# Patient Record
Sex: Male | Born: 1970
Health system: Southern US, Community
[De-identification: ages and names within clinical notes are randomized; demographics above are authoritative.]

## PROBLEM LIST (undated history)

## (undated) DIAGNOSIS — C9110 Chronic lymphocytic leukemia of B-cell type not having achieved remission: Secondary | ICD-10-CM

## (undated) DIAGNOSIS — I10 Essential (primary) hypertension: Secondary | ICD-10-CM

---

## 2008-11-03 ENCOUNTER — Emergency Department (HOSPITAL_COMMUNITY): Admission: EM | Admit: 2008-11-03 | Discharge: 2008-11-03 | Payer: Self-pay | Admitting: Emergency Medicine

## 2011-04-19 NOTE — Consult Note (Signed)
NAMEORESTE, MAJEED                 ACCOUNT NO.:  [id number]   MEDICAL RECORD NO.:  [id number]          PATIENT TYPE:  EMS   LOCATION:  [facility]                         FACILITY:  [facility]   PHYSICIAN:  Pramod P. Pearlean Brownie, MD    DATE OF BIRTH:  03/28/1971   DATE OF CONSULTATION:  DATE OF DISCHARGE:  11/03/2008                                 CONSULTATION   REFERRING PHYSICIAN:  Orlene Och, MD   REASON FOR REFERRAL:  Headache.   HISTORY OF PRESENT ILLNESS:  Mr. Brosh is a 40 year old Caucasian male  who is a Emergency planning/management officer who states that this morning he developed  left frontal headache as well as feeling of sweating and after an hour  or so, the headache got severe.  He came to the emergency room where he  was given Dilaudid which helped reduce the headache from 8/10 to 2/10.  However, an hour later, now the headache is coming back and it is back  up to 7/10.  He did have some nausea and vomiting early on, but after  the Dilaudid that seems to settle.  He also had some blurred vision and  had difficulty focusing and feeling sweaty with the headaches but that  also seems to have settled down now.  He denied any double vision,  vertigo, focal extremity weakness, numbness, gait, or balance problems.  Denies any eye pain, fever, nausea, light sensitivity, sound  sensitivity, neck stiffness, and neck pain.  He has no prior history of  migraine headaches, similar headaches in the past.   PAST MEDICAL HISTORY:  Significant for hypertension, allergies, and  hyperlipidemia.   HOME MEDICATIONS:  Lisinopril, Nasonex, and Lipitor.   SOCIAL HISTORY:  The patient lives in [place].  He does not smoke.   REVIEW OF SYSTEMS:  Negative for recent fever, cough, diarrhea, illness,  shortness of breath, or chest pain.   PHYSICAL EXAM:  GENERAL:  A well-built young Caucasian male who is not  in distress.  VITAL SIGNS:  He afebrile.  Pulse rate 64 per minute and regular,  temperature 97.6, blood  pressure 131/86, respiratory rate 20 per minute,  sats 100% on room air.  HEENT:  Head is nontraumatic.  NECK:  Supple.  There is no stiffness.  CARDIAC:  Regular heart sounds.  LUNGS:  Clear to auscultation.  ABDOMEN:  Soft and nontender.  NEUROLOGIC:  The patient is pleasant, awake, alert, cooperative with no  aphasia, apraxia, or dysarthria.  Eye movements are full range.  Visual  acuity and fields are adequate to bedside testing.  Face is symmetric.  Tongue is midline.  Motor system exam reveals no upper extremity drift,  symmetric strength, tone, reflexes, coordination, sensation.  His gait  was not tested.   DATA REVIEWED:  Noncontrast CAT scan of the head done today reveals no  acute abnormality.  No subarachnoid blood is noted.   LABS:  WBC count is normal.  Electrolytes are normal.  Coagulation labs  are normal.   IMPRESSION:  A 40 year old gentleman with new onset severe refractory  left frontal  headache of undetermined etiology.  Subarachnoid hemorrhage  is still with small possibility despite the negative CT and further  evaluation is necessary.   PLAN:  I would recommend checking MRI scan of the brain with particular  attention to the FLAIR images for subarachnoid blood as well as to rule  out an underlying structural lesion.  If this is negative and the  patient's symptoms are not resolved, I need to do a spinal tap as well  to look for red cells.  Treat his headache with IV Toradol 30 mg.  We  will follow up from the results of the MRI and spinal tap if necessary.  Kindly call for questions.           ______________________________  Sunny Schlein. Pearlean Brownie, MD     PPS/MEDQ  D:  11/03/2008  T:  11/04/2008  Job:  [id number]

## 2011-09-06 LAB — DIFFERENTIAL
Eosinophils Absolute: 0 10*3/uL (ref 0.0–0.7)
Eosinophils Relative: 1 % (ref 0–5)
Lymphocytes Relative: 32 % (ref 12–46)
Lymphs Abs: 2.5 10*3/uL (ref 0.7–4.0)
Monocytes Relative: 9 % (ref 3–12)
Neutro Abs: 4.6 10*3/uL (ref 1.7–7.7)

## 2011-09-06 LAB — POCT CARDIAC MARKERS
CKMB, poc: 2.2 ng/mL (ref 1.0–8.0)
Myoglobin, poc: 77.2 ng/mL (ref 12–200)
Troponin i, poc: <0.05 ng/mL (ref 0.00–0.09)

## 2011-09-06 LAB — CBC
HCT: 45.8 % (ref 39.0–52.0)
Hemoglobin: 15.3 g/dL (ref 13.0–17.0)
MCV: 89 fL (ref 78.0–100.0)
Platelets: 249 10*3/uL (ref 150–400)
RDW: 13.1 % (ref 11.5–15.5)

## 2011-09-06 LAB — POCT I-STAT, CHEM 8
BUN: 15 mg/dL (ref 6–23)
Calcium, Ion: 1.17 mmol/L (ref 1.12–1.32)
Chloride: 106 meq/L (ref 96–112)
Creatinine, Ser: 0.9 mg/dL (ref 0.4–1.5)
Glucose, Bld: 111 mg/dL — ABNORMAL HIGH (ref 70–99)
HCT: 47 % (ref 39.0–52.0)
Hemoglobin: 16 g/dL (ref 13.0–17.0)
Potassium: 3.5 meq/L (ref 3.5–5.1)
Sodium: 140 meq/L (ref 135–145)
TCO2: 25 mmol/L (ref 0–100)

## 2011-09-06 LAB — PROTIME-INR: Prothrombin Time: 13.2 seconds (ref 11.6–15.2)

## 2011-09-06 LAB — GLUCOSE, CAPILLARY: Glucose-Capillary: 120 mg/dL — ABNORMAL HIGH (ref 70–99)

## 2014-10-07 ENCOUNTER — Encounter: Payer: Self-pay | Admitting: Physical Therapy

## 2014-10-07 ENCOUNTER — Ambulatory Visit: Attending: Sports Medicine | Admitting: Physical Therapy

## 2014-10-07 VITALS — BP 160/102

## 2014-10-07 DIAGNOSIS — Z8781 Personal history of (healed) traumatic fracture: Secondary | ICD-10-CM | POA: Insufficient documentation

## 2014-10-07 DIAGNOSIS — M25512 Pain in left shoulder: Secondary | ICD-10-CM | POA: Diagnosis present

## 2014-10-07 DIAGNOSIS — S42002A Fracture of unspecified part of left clavicle, initial encounter for closed fracture: Secondary | ICD-10-CM

## 2014-10-07 NOTE — Patient Instructions (Addendum)
Shoulder Exercises EXERCISES  RANGE OF MOTION (ROM) AND STRETCHING EXERCISES These exercises may help you when beginning to rehabilitate your injury. Your symptoms may resolve with or without further involvement from your physician, physical therapist or athletic trainer. While completing these exercises, remember:   Restoring tissue flexibility helps normal motion to return to the joints. This allows healthier, less painful movement and activity.  An effective stretch should be held for at least 30 seconds.  A stretch should never be painful. You should only feel a gentle lengthening or release in the stretched tissue. ROM - Pendulum  Bend at the waist so that your right / left arm falls away from your body. Support yourself with your opposite hand on a solid surface, such as a table or a countertop.  Your right / left arm should be perpendicular to the ground. If it is not perpendicular, you need to lean over farther. Relax the muscles in your right / left arm and shoulder as much as possible.  Gently sway your hips and trunk so they move your right / left arm without any use of your right / left shoulder muscles.  Progress your movements so that your right / left arm moves side to side, then forward and backward, and finally, both clockwise and counterclockwise.  Complete __________ repetitions in each direction. Many people use this exercise to relieve discomfort in their shoulder as well as to gain range of motion. Repeat __________ times. Complete this exercise __________ times per day. STRETCH - Flexion, Standing  Stand with good posture. With an underhand grip on your right / left hand and an overhand grip on the opposite hand, grasp a broomstick or cane so that your hands are a little more than shoulder-width apart.  Keeping your right / left elbow straight and shoulder muscles relaxed, push the stick with your opposite hand to raise your right / left arm in front of your body and  then overhead. Raise your arm until you feel a stretch in your right / left shoulder, but before you have increased shoulder pain.  Try to avoid shrugging your right / left shoulder as your arm rises by keeping your shoulder blade tucked down and toward your mid-back spine. Hold __________ seconds.  Slowly return to the starting position. Repeat __________ times. Complete this exercise __________ times per day. STRETCH - Internal Rotation  Place your right / left hand behind your back, palm-up.  Throw a towel or belt over your opposite shoulder. Grasp the towel/belt with your right / left hand.  While keeping an upright posture, gently pull up on the towel/belt until you feel a stretch in the front of your right / left shoulder.  Avoid shrugging your right / left shoulder as your arm rises by keeping your shoulder blade tucked down and toward your mid-back spine.  Hold __________. Release the stretch by lowering your opposite hand. Repeat __________ times. Complete this exercise __________ times per day. STRETCH - External Rotation and Abduction  Stagger your stance through a doorframe. It does not matter which foot is forward.  As instructed by your physician, physical therapist or athletic trainer, place your hands:  And forearms above your head and on the door frame.  And forearms at head-height and on the door frame.  At elbow-height and on the door frame.  Keeping your head and chest upright and your stomach muscles tight to prevent over-extending your low-back, slowly shift your weight onto your front foot until you feel   a stretch across your chest and/or in the front of your shoulders.  Hold __________ seconds. Shift your weight to your back foot to release the stretch. Repeat __________ times. Complete this stretch __________ times per day.

## 2014-10-07 NOTE — Therapy (Signed)
Physical Therapy Evaluation  Patient Details  Name: Aaron Diaz MRN: [id number] Date of Birth: Oct 31, 1971  Encounter Date: 10/07/2014      PT End of Session - 10/07/14 1621    Visit Number 1   Number of Visits 5   Date for PT Re-Evaluation 11/11/14   Authorization - Number of Visits 5   PT Start Time 1546   PT Stop Time 1618   PT Time Calculation (min) 32 min   Activity Tolerance Patient tolerated treatment well;Patient limited by pain      No past medical history on file.  No past surgical history on file.  BP 160/102 mmHg  Visit Diagnosis:  Left shoulder pain  Fx clavicle, left, closed, initial encounter      Subjective Assessment - 10/07/14 1553    Symptoms Pt is a 43 y/o male who presents to OPPT s/p L clavicle fx.  Pt. reported fx occurred in [place] after a fall playing football 08/25/14.  S/P ORIF 09/02/14.     Pertinent History HTN   Limitations House hold activities;Lifting  no lifting until 6 weeks post-op   Patient Stated Goals improve full ROM, increase strength   Currently in Pain? Yes   Pain Score 2    Pain Location Shoulder   Pain Orientation Left   Pain Descriptors / Indicators Aching;Discomfort   Pain Type Surgical pain   Pain Onset More than a month ago   Pain Frequency Intermittent   Aggravating Factors  nothing   Pain Relieving Factors nothing   Multiple Pain Sites No          [facility] PT Assessment - 10/07/14 1500    Assessment   Medical Diagnosis L clavicle fx s/p ORIF   Onset Date 09/02/14   Next MD Visit 11/04/14   Prior Therapy n/a   Precautions   Precautions Shoulder   Type of Shoulder Precautions no lifting > 5#   Restrictions   Weight Bearing Restrictions Yes   LUE Weight Bearing Non weight bearing   Balance Screen   Has the patient fallen in the past 6 months Yes   How many times? 1   Has the patient had a decrease in activity level because of a fear of falling?  No   Is the patient reluctant to leave their home because of  a fear of falling?  No   Home Environment   Family/patient expects to be discharged to: Private residence   Living Arrangements Spouse/significant other;Children   Available Help at Discharge Available PRN/intermittently;Family   Type of [place] to enter   Entrance Stairs-Number of Steps 4   Entrance Stairs-Rails None   Home Layout Two level   Alternate Level Stairs-Number of Steps 13   Alternate Level Stairs-Rails Can reach both   Home Equipment None   Prior Function   Level of Independence Independent with homemaking with ambulation;Independent with gait;Independent with transfers   Vocation Full time employment  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   Vocation Requirements sitting and standing throughout the day, no heavy lifting   Observation/Other Assessments   Focus on Therapeutic Outcomes (FOTO)  not completed; staff did not capture   Posture/Postural Control   Posture/Postural Control Postural limitations   Postural Limitations rounded shoulders and forward head posture   AROM   Overall AROM  Deficits   Overall AROM Comments L elbow and wrist WFL   Left Shoulder Flexion 119 Degrees   Left Shoulder ABduction 87 Degrees  Left Shoulder Internal Rotation 90 Degrees   Left Shoulder External Rotation 70 Degrees   Strength   Left Shoulder Flexion 3-/5   Left Shoulder ABduction 3-/5   Left Shoulder Internal Rotation 2/5   Left Shoulder External Rotation 2/5          Adult PT Treatment/Exercise - 10/07/14 0700    Exercises   Exercises Shoulder   Shoulder Exercises: Standing   Flexion AAROM;Left;10 reps;Other (comment)  cane   ABduction AAROM;Left;10 reps;Other (comment)  cane   Shoulder Exercises: Stretch   Corner Stretch 2 reps;20 seconds   Internal Rotation Stretch 2 reps  20 seconds, L shoulder          Education - 10/07/14 1621    Education provided Yes   Education Details HEP   Education Details Patient   Methods  Explanation;Demonstration;Handout   Comprehension Verbalized understanding;Returned demonstration            PT Long Term Goals - 10/07/14 1624    PT LONG TERM GOAL #1   Title independent with advanced HEP   Time 5   Period Weeks   Status New   PT LONG TERM GOAL #2   Title improve AROM L shoulder flexion to > 130 for improved function   Time 5   Period Weeks   Status New   PT LONG TERM GOAL #3   Title improve AROM L shoulder abdct to > 100 degrees for improved function   Time 5   Period Weeks   PT LONG TERM GOAL #4   Title report pain < 2/10 with exercises and activity   Time 5   Period Weeks   Status New          Plan - 10/07/14 1622    Clinical Impression Statement Pt. presents to OPPT for the above stated limitations.  Will benefit from skilled physical therapy to address deficits listed above and maximize function.   Pt will benefit from skilled therapeutic intervention in order to improve on the following deficits Decreased strength;Pain;Improper body mechanics;Decreased range of motion;Impaired perceived functional ability;Impaired UE functional use   Rehab Potential Good   PT Frequency Min 1X/week   PT Duration Other (comment)  5 weeks   PT Treatment/Interventions Functional mobility training;Therapeutic activities;Patient/family education;Neuromuscular re-education;Modalities;Manual techniques;Therapeutic exercise  MHP/CP, estim, ultrasound, vaso compression   PT Plan review HEP, progress ROM, can add strengthening exercises once pt. > 6 weeks post-op        Problem List There are no active problems to display for this patient.  Certification faxed to MD due to provider not in system.                                          Faustino Congress K 10/07/2014, 5:03 PM

## 2014-10-22 ENCOUNTER — Ambulatory Visit

## 2014-10-22 ENCOUNTER — Telehealth: Payer: Self-pay | Admitting: *Deleted

## 2014-10-22 DIAGNOSIS — M25512 Pain in left shoulder: Secondary | ICD-10-CM | POA: Diagnosis not present

## 2014-10-22 DIAGNOSIS — S42002D Fracture of unspecified part of left clavicle, subsequent encounter for fracture with routine healing: Secondary | ICD-10-CM

## 2014-10-22 NOTE — Telephone Encounter (Signed)
appts made and printed...td 

## 2014-10-22 NOTE — Patient Instructions (Signed)
Cane Overhead - Standing   PALMS FACE UP. With arms straight, hold cane forward at waist. Raise cane above head. Hold _3__ seconds. Repeat _10__ times. Do _2__ times per day.   Scapular Retraction (Standing)   With arms at sides, pinch shoulder blades together. Repeat __10__ times per set. Do __2__ sets per session. Do __10_ sessions per day.  http://orth.exer.us/944    Copyright  VHI. All rights reserved.   Shoulder Press: Supine with Protraction   (Without tubing) push fists further toward ceiling from shoulder blades. Repeat _10_ times per set. Do _2_ sets per session. Do _2_ sessions per week.  http://tub.exer.us/291   Copyright  VHI. All rights reserved.

## 2014-10-22 NOTE — Therapy (Signed)
Physical Therapy Treatment  Patient Details  Name: Aaron Diaz MRN: [id number] Date of Birth: 1971-01-26  Encounter Date: 10/22/2014      PT End of Session - 10/22/14 1723    Visit Number 2   Number of Visits 5   Date for PT Re-Evaluation 11/11/14   PT Start Time 1630   PT Stop Time 1726   PT Time Calculation (min) 56 min   Activity Tolerance Patient tolerated treatment well;Patient limited by pain   Behavior During Therapy [facility] for tasks assessed/performed      No past medical history on file.  No past surgical history on file.  There were no vitals taken for this visit.  Visit Diagnosis:  No diagnosis found.      Subjective Assessment - 10/22/14 1633    Symptoms Pt sees MD for Follow up  on 11/04/14 with Dr Davina Poke.  pt reports he is doing well, 50% improved and limited by MD order: no lifting for remainer for this week, and may start next week lifting 5 lbs.    Pertinent History HTN   Limitations House hold activities;Lifting   Patient Stated Goals improve full ROM, increase strength   Currently in Pain? Yes   Pain Score 1    Pain Location Shoulder   Pain Orientation Left   Pain Descriptors / Indicators Aching;Discomfort   Pain Type Surgical pain   Pain Onset More than a month ago   Pain Frequency Intermittent          [facility] PT Assessment - 10/22/14 1638    AROM   Left Shoulder Flexion 132 Degrees   Left Shoulder ABduction 119 Degrees   Left Shoulder Internal Rotation 90 Degrees  FIR T12   Left Shoulder External Rotation 70 Degrees  T2   Strength   Left Shoulder Flexion 3/5   Left Shoulder ABduction 3/5   Left Shoulder Internal Rotation 3+/5   Left Shoulder External Rotation 3+/5          [facility] Adult PT Treatment/Exercise - 10/22/14 1643    Shoulder Exercises: Standing   External Rotation AAROM;Left;10 reps   External Rotation Weight (lbs) 10 sec hold   Flexion AAROM;Left;10 reps;Other (comment)  cane, palms up   ABduction AAROM;Left;10 reps;Other  (comment)  cane, supinated   Retraction Strengthening;10 reps  5 sec hold   Other Standing Exercises AAROM flexion, ball on wall, 10 x 2 sets   Shoulder Exercises: Pulleys   Flexion 2 minutes   ABduction 2 minutes   Modalities   Modalities Cryotherapy   Cryotherapy   Number Minutes Cryotherapy 10 Minutes   Cryotherapy Location Shoulder   Type of Cryotherapy Ice pack   Manual Therapy   Manual Therapy Joint mobilization   Joint Mobilization grade 2-3 post inf glide to promote flex/ abd. genlte GH distraction          PT Education - 10/22/14 1723    Education provided Yes   Education Details Progressed HEP   Person(s) Educated Patient   Methods Explanation;Demonstration;Verbal cues;Handout   Comprehension Verbalized understanding;Returned demonstration;Verbal cues required;Tactile cues required              Plan - 10/22/14 1726    Clinical Impression Statement Reassessed AROM and strength with good improvements noted since last visit. Pt progresses to lifting 5 lb next week. Currently, remains restricted with lifting. Progressed HEP with AAROM ther ex. Pt would benefit from continued PT to progress pt to goals and return to PLOF.  Pt will benefit from skilled therapeutic intervention in order to improve on the following deficits Pain;Decreased mobility;Decreased strength;Decreased range of motion;Decreased activity tolerance;Impaired UE functional use   Rehab Potential Excellent   PT Frequency 2x / week   PT Treatment/Interventions ADLs/Self Care Home Management;Therapeutic activities;Therapeutic exercise;Manual techniques   PT Next Visit Plan Progress HEP.  PREs after 6 weeks post op.    Consulted and Agree with Plan of Care Patient   PT Plan review HEP, progress ROM, can add strengthening exercises once pt. > 6 weeks post-op        Problem List There are no active problems to display for this  patient.                                             Dollene Cleveland, PT 10/22/2014, 5:32 PM

## 2014-11-04 ENCOUNTER — Encounter

## 2014-11-06 ENCOUNTER — Ambulatory Visit: Attending: Nurse Practitioner

## 2014-11-06 DIAGNOSIS — S42002S Fracture of unspecified part of left clavicle, sequela: Secondary | ICD-10-CM

## 2014-11-06 DIAGNOSIS — Z8781 Personal history of (healed) traumatic fracture: Secondary | ICD-10-CM | POA: Insufficient documentation

## 2014-11-06 DIAGNOSIS — M25512 Pain in left shoulder: Secondary | ICD-10-CM | POA: Insufficient documentation

## 2014-11-06 NOTE — Therapy (Signed)
Outpatient [facility] [address] [place], [place], [address] Phone: [phone]   Fax:  [phone]  Physical Therapy Treatment  Patient Details  Name: Aaron Diaz MRN: [id number] Date of Birth: 08-01-1971  Encounter Date: 11/06/2014      PT End of Session - 11/06/14 1712    Visit Number 3   Number of Visits 5   Date for PT Re-Evaluation 11/11/14   Authorization - Number of Visits 5   PT Start Time 1630   PT Stop Time 1720   PT Time Calculation (min) 50 min   Activity Tolerance Patient tolerated treatment well   Behavior During Therapy [facility] for tasks assessed/performed      No past medical history on file.  No past surgical history on file.  There were no vitals taken for this visit.  Visit Diagnosis:  Fracture of left clavicle, sequela      Subjective Assessment - 11/06/14 1640    Symptoms Pt rates pain 2/10 today. Pt reports "it is getting better". Pt saw MD yesterday and MD said all is healing well. Plans to Follow-up with MD in 4 weeks.    Pertinent History HTN   Patient Stated Goals improve full ROM, increase strength   Currently in Pain? Yes   Pain Score 2    Pain Location Shoulder   Pain Orientation Left   Pain Descriptors / Indicators Aching;Discomfort   Pain Type Surgical pain   Pain Onset More than a month ago            [facility] Adult PT Treatment/Exercise - 11/06/14 1651    Shoulder Exercises: Supine   Protraction Strengthening;20 reps;Weights   Protraction Weight (lbs) 1   Flexion Strengthening;20 reps;Weights   Shoulder Flexion Weight (lbs) 1   Flexion Limitations chest press movement     Other Supine Exercises UBE 2 min forward, 2 min back    Shoulder Exercises: Sidelying   External Rotation AROM;Left;20 reps  10 reps x 3 sets   External Rotation Weight (lbs) 0   ABduction AROM;Left;20 reps  10 x 3 sets   ABduction Weight (lbs) 0   Shoulder Exercises: Standing   External Rotation AROM;Left;10 reps   Sidelying   Flexion AROM;Left;20 reps;Other (comment)  palms up, 10 x 2   ABduction AROM;Left;20 reps;Other (comment)   supinated, 10 x 2   Retraction Strengthening;Both;10 reps;Theraband  5 sec hold   Theraband Level (Shoulder Retraction) Level 2 (Red)   Other Standing Exercises AROM flexion, ball on wall, 10 x 2 sets   Shoulder Exercises: Pulleys   Flexion 2 minutes   ABduction 2 minutes                Plan - 11/06/14 1712    Clinical Impression Statement Improved pain-free PROM and AROM today. Progressed to light resistance and AROM ther ex with no increase in pain. Pt would benefit from continued PT to return to PLOF.    Pt will benefit from skilled therapeutic intervention in order to improve on the following deficits Pain;Decreased mobility;Decreased strength;Decreased range of motion;Decreased activity tolerance;Impaired UE functional use   Rehab Potential Excellent   PT Frequency 2x / week   PT Treatment/Interventions ADLs/Self Care Home Management;Therapeutic activities;Therapeutic exercise;Manual techniques   PT Next Visit Plan Progress HEP.  PREs after 6 weeks post op.  Problem List There are no active problems to display for this patient.  Dollene Cleveland, PT, DPT 11/06/2014 5:27 PM Phone: [phone] Fax: [phone]

## 2014-11-10 ENCOUNTER — Ambulatory Visit: Admitting: Physical Therapy

## 2014-11-10 DIAGNOSIS — S42002A Fracture of unspecified part of left clavicle, initial encounter for closed fracture: Secondary | ICD-10-CM

## 2014-11-10 DIAGNOSIS — M25512 Pain in left shoulder: Secondary | ICD-10-CM | POA: Diagnosis not present

## 2014-11-10 DIAGNOSIS — S42002S Fracture of unspecified part of left clavicle, sequela: Secondary | ICD-10-CM

## 2014-11-10 DIAGNOSIS — S42002D Fracture of unspecified part of left clavicle, subsequent encounter for fracture with routine healing: Secondary | ICD-10-CM

## 2014-11-10 NOTE — Therapy (Signed)
Outpatient Rehabilitation [facility] [address] [place], [place], [address] Phone: [phone]   Fax:  [phone]  Physical Therapy Treatment  Patient Details  Name: Aaron Diaz MRN: [id number] Date of Birth: [birth year]  Encounter Date: 11/10/2014      PT End of Session - 11/10/14 1702    Visit Number 4   Number of Visits 5   Date for PT Re-Evaluation 11/11/14   Authorization - Number of Visits 5   PT Start Time 1631   PT Stop Time 1712   PT Time Calculation (min) 41 min   Activity Tolerance Patient tolerated treatment well   Behavior During Therapy [facility] for tasks assessed/performed      No past medical history on file.  No past surgical history on file.  There were no vitals taken for this visit.  Visit Diagnosis:  Fracture of left clavicle, sequela  Clavicle fracture, left, with routine healing, subsequent encounter  Left shoulder pain  Fx clavicle, left, closed, initial encounter      Subjective Assessment - 11/10/14 1634    Symptoms Pt reports L shoulder improving; everything healing well.   Pertinent History HTN   Limitations House hold activities;Lifting   Patient Stated Goals improve full ROM, increase strength   Currently in Pain? Yes   Pain Score 3    Pain Location Shoulder   Pain Orientation Left   Pain Descriptors / Indicators Aching;Discomfort   Pain Type Surgical pain   Pain Onset More than a month ago   Pain Frequency Intermittent   Aggravating Factors  cold weather   Multiple Pain Sites No            [facility] Adult PT Treatment/Exercise - 11/10/14 1635    Exercises   Exercises Shoulder   Shoulder Exercises: Supine   Protraction Strengthening;Left;Weights;15 reps  x 2 sets   Protraction Weight (lbs) 2   Flexion Strengthening;Left;15 reps;Weights  x 2 reps   Shoulder Flexion Weight (lbs) 2   Flexion Limitations chest press with 2#x15 reps; 3# x 15 reps   Shoulder Exercises: Prone   Flexion Strengthening;Left;15  reps;Weights  x 2 sets   Flexion Weight (lbs) 2   Extension Strengthening;Left;15 reps;Weights  x2sets   Extension Weight (lbs) 2   Horizontal ABduction 1 Strengthening;Left;15 reps;Weights  x2 sets   Horizontal ABduction 1 Weight (lbs) 2   Shoulder Exercises: Sidelying   External Rotation Strengthening;Left;15 reps;Weights  x 2 sets   External Rotation Weight (lbs) 2   ABduction Strengthening;Left;15 reps;Weights  x 2 sets   ABduction Weight (lbs) 2   Cryotherapy   Number Minutes Cryotherapy 10 Minutes   Cryotherapy Location Shoulder   Type of Cryotherapy Ice pack   Shoulder Exercises: ROM/Strengthening   UBE (Upper Arm Bike) Level 3.5 x 8 min, alt 1 min forward, 1 min backwards          PT Education - 11/10/14 1702    Education provided No            PT Long Term Goals - 11/10/14 1705    PT LONG TERM GOAL #1   Title independent with advanced HEP   Time 5   Period Weeks   Status On-going   PT LONG TERM GOAL #2   Title improve AROM L shoulder flexion to > 130 for improved function   Time 5   Period Weeks   Status On-going   PT LONG TERM GOAL #3   Title improve AROM L shoulder abdct to >  100 degrees for improved function   Time 5   Period Weeks   Status On-going   PT LONG TERM GOAL #4   Title report pain < 2/10 with exercises and activity   Time 5   Period Weeks   Status On-going          Plan - 11/10/14 1703    Clinical Impression Statement Pt progressing ROM and strengthening.  Pt c/o of dull ache and muscle fatigue after session.  Will plan to d/c next visit.   PT Next Visit Plan Check goals, d/c, FOTO, update HEP as needed   Consulted and Agree with Plan of Care Patient                               Problem List There are no active problems to display for this patient.    Laureen Abrahams, PT, DPT 11/10/2014 5:14 PM  [facility] [address] [place] [address]  [phone]  (office) [phone] (fax)

## 2014-11-13 ENCOUNTER — Ambulatory Visit: Admitting: Physical Therapy

## 2014-11-13 DIAGNOSIS — S42002S Fracture of unspecified part of left clavicle, sequela: Secondary | ICD-10-CM

## 2014-11-13 DIAGNOSIS — S42002A Fracture of unspecified part of left clavicle, initial encounter for closed fracture: Secondary | ICD-10-CM

## 2014-11-13 DIAGNOSIS — S42002D Fracture of unspecified part of left clavicle, subsequent encounter for fracture with routine healing: Secondary | ICD-10-CM

## 2014-11-13 DIAGNOSIS — M25512 Pain in left shoulder: Secondary | ICD-10-CM

## 2014-11-13 NOTE — Patient Instructions (Signed)
Strengthening: Resisted Internal Rotation   Hold tubing in left hand, elbow at side and forearm out. Rotate forearm in across body. Repeat _10-15___ times per set. Do _1___ sets per session. Do __1-3__ sessions per day.  http://orth.exer.us/830   Copyright  VHI. All rights reserved.  Strengthening: Resisted External Rotation   Hold tubing in right hand, elbow at side and forearm across body. Rotate forearm out. Repeat _10-15___ times per set. Do __1__ sets per session. Do _1-3___ sessions per day.  http://orth.exer.us/828   Copyright  VHI. All rights reserved.  Strengthening: Resisted Flexion   Hold tubing with left arm at side. Pull forward and up. Move shoulder through pain-free range of motion. Repeat _10-15___ times per set. Do _1___ sets per session. Do _1-3___ sessions per day.  http://orth.exer.us/824   Copyright  VHI. All rights reserved.  Strengthening: Resisted Extension   Hold tubing in right hand, arm forward. Pull arm back, elbow straight. Repeat _10-15___ times per set. Do __1__ sets per session. Do _1-3___ sessions per day.  http://orth.exer.us/832   Copyright  VHI. All rights reserved.   Strengthening: Resisted Abduction   Hold tubing with right arm across body. Pull up and away from side. Move through pain-free range of motion. Repeat __10-15__ times per set. Do _1___ sets per session. Do _1-3__ sessions per day.  http://orth.exer.us/826   Copyright  VHI. All rights reserved.   Laureen Abrahams, PT, DPT 11/13/2014 4:53 PM  [facility] [address], [place] [address]  [phone] (office) [phone] (fax)

## 2014-11-13 NOTE — Therapy (Signed)
Outpatient Rehabilitation [facility] [address] [place], [place], [address] Phone: [phone]   Fax:  [phone]  Physical Therapy Treatment/Discharge  Patient Details  Name: Aaron Diaz MRN: [id number] Date of Birth: 10-28-1971  Encounter Date: 11/13/2014      PT End of Session - 11/13/14 1658    Visit Number 5   Number of Visits 5   PT Start Time 1628   PT Stop Time 1653   PT Time Calculation (min) 25 min   Activity Tolerance Patient tolerated treatment well   Behavior During Therapy [facility] for tasks assessed/performed      No past medical history on file.  No past surgical history on file.  There were no vitals taken for this visit.  Visit Diagnosis:  Fracture of left clavicle, sequela  Clavicle fracture, left, with routine healing, subsequent encounter  Left shoulder pain  Fx clavicle, left, closed, initial encounter      Subjective Assessment - 11/13/14 1631    Symptoms Lifting up to 10# now, arm feels better   Pertinent History HTN   Limitations House hold activities;Lifting   Patient Stated Goals improve full ROM, increase strength   Currently in Pain? Yes   Pain Score 3    Pain Location Shoulder   Pain Orientation Left   Pain Type Surgical pain   Pain Onset More than a month ago   Pain Frequency Intermittent   Aggravating Factors  cold weather          [facility] PT Assessment - 11/13/14 1700    AROM   Left Shoulder Flexion 155 Degrees   Left Shoulder ABduction 149 Degrees          [facility] Adult PT Treatment/Exercise - 11/13/14 1632    Shoulder Exercises: Standing   Protraction Strengthening;Left;10 reps;Theraband   Theraband Level (Shoulder Protraction) Level 3 (Green)   External Rotation Strengthening;Left;10 reps;Theraband   Theraband Level (Shoulder External Rotation) Level 3 (Green)   Internal Rotation Strengthening;Left;10 reps;Theraband   Theraband Level (Shoulder Internal Rotation) Level 3 (Green)   Flexion  Strengthening;Left;10 reps;Theraband   Theraband Level (Shoulder Flexion) Level 3 (Green)   ABduction Strengthening;Left;10 reps;Theraband   Theraband Level (Shoulder ABduction) Level 2 (Red)   Retraction Strengthening;Left;10 reps;Theraband   Theraband Level (Shoulder Retraction) Level 3 (Green)   Shoulder Exercises: ROM/Strengthening   UBE (Upper Arm Bike) Level 4.5 x 8 min, alt 1 min forward, 1 min backwards          PT Education - 11/13/14 1657    Education provided Yes   Education Details HEP, scar mobilization, progression and transition to community fitness   Person(s) Educated Patient   Methods Explanation;Demonstration;Handout   Comprehension Verbalized understanding;Returned demonstration            PT Long Term Goals - 11/13/14 1700    PT LONG TERM GOAL #1   Title independent with advanced HEP   Time 5   Period Weeks   Status Achieved   PT LONG TERM GOAL #2   Title improve AROM L shoulder flexion to > 130 for improved function   Time 5   Period Weeks   Status Achieved   PT LONG TERM GOAL #3   Title improve AROM L shoulder abdct to > 100 degrees for improved function   Time 5   Period Weeks   Status Achieved   PT LONG TERM GOAL #4   Title report pain < 2/10 with exercises and activity   Time 5   Period  Weeks   Status Not Met          Plan - 11/13/14 1659    Clinical Impression Statement Pt has met all goals except pain still 3/10.  Pt demonstrates improved ROM and understanding for safe transition to community fitness.     PT Next Visit Plan d/c PT   Consulted and Agree with Plan of Care Patient                               Problem List There are no active problems to display for this patient.  Laureen Abrahams, PT, DPT 11/13/2014 5:02 PM  [facility] [address] [place], [place] [address]  [phone] (office) [phone] (fax)   PHYSICAL THERAPY DISCHARGE SUMMARY  Visits from Start  of Care: 5  Current functional level related to goals / functional outcomes: See above   Remaining deficits: L shoulder/scar pain: Rated 3/10 at d/c which is decreased from initial eval.  Pt continues to demonstrate improvements and is ready to safely transition to home and community programs.   Education / Equipment: HEP, transition to community fitness  Plan: Patient agrees to discharge.  Patient goals were met. Patient is being discharged due to meeting the stated rehab goals.  ?????        Laureen Abrahams, PT, DPT 11/13/2014 5:03 PM  [facility] [address] [place] [address]  [phone] (office) [phone] (fax)

## 2017-01-04 DIAGNOSIS — B349 Viral infection, unspecified: Secondary | ICD-10-CM | POA: Diagnosis not present

## 2017-01-04 DIAGNOSIS — J029 Acute pharyngitis, unspecified: Secondary | ICD-10-CM | POA: Diagnosis not present

## 2017-01-04 DIAGNOSIS — I10 Essential (primary) hypertension: Secondary | ICD-10-CM | POA: Diagnosis not present

## 2017-03-01 DIAGNOSIS — Z0000 Encounter for general adult medical examination without abnormal findings: Secondary | ICD-10-CM | POA: Diagnosis not present

## 2017-03-08 DIAGNOSIS — I10 Essential (primary) hypertension: Secondary | ICD-10-CM | POA: Diagnosis not present

## 2017-03-08 DIAGNOSIS — J302 Other seasonal allergic rhinitis: Secondary | ICD-10-CM | POA: Diagnosis not present

## 2017-03-08 DIAGNOSIS — Z1389 Encounter for screening for other disorder: Secondary | ICD-10-CM | POA: Diagnosis not present

## 2017-03-08 DIAGNOSIS — Z0000 Encounter for general adult medical examination without abnormal findings: Secondary | ICD-10-CM | POA: Diagnosis not present

## 2017-03-08 DIAGNOSIS — E784 Other hyperlipidemia: Secondary | ICD-10-CM | POA: Diagnosis not present

## 2017-03-09 DIAGNOSIS — Z1212 Encounter for screening for malignant neoplasm of rectum: Secondary | ICD-10-CM | POA: Diagnosis not present

## 2017-04-05 DIAGNOSIS — I10 Essential (primary) hypertension: Secondary | ICD-10-CM | POA: Diagnosis not present

## 2017-04-05 DIAGNOSIS — G4700 Insomnia, unspecified: Secondary | ICD-10-CM | POA: Diagnosis not present

## 2017-04-05 DIAGNOSIS — G8929 Other chronic pain: Secondary | ICD-10-CM | POA: Diagnosis not present

## 2017-04-24 DIAGNOSIS — N390 Urinary tract infection, site not specified: Secondary | ICD-10-CM | POA: Diagnosis not present

## 2017-04-24 DIAGNOSIS — R350 Frequency of micturition: Secondary | ICD-10-CM | POA: Diagnosis not present

## 2017-06-14 DIAGNOSIS — J209 Acute bronchitis, unspecified: Secondary | ICD-10-CM | POA: Diagnosis not present

## 2017-06-14 DIAGNOSIS — R05 Cough: Secondary | ICD-10-CM | POA: Diagnosis not present

## 2017-09-04 ENCOUNTER — Telehealth: Payer: Self-pay | Admitting: Hematology

## 2017-09-04 DIAGNOSIS — R0683 Snoring: Secondary | ICD-10-CM | POA: Diagnosis not present

## 2017-09-04 DIAGNOSIS — D72829 Elevated white blood cell count, unspecified: Secondary | ICD-10-CM | POA: Diagnosis not present

## 2017-09-04 DIAGNOSIS — R5383 Other fatigue: Secondary | ICD-10-CM | POA: Diagnosis not present

## 2017-09-04 NOTE — Telephone Encounter (Signed)
Appt has been scheduled for the pt to see Dr. Irene Limbo on 10/2 at 220pm. Misty from Dr. Jacquiline Doe office will notify the pt and fax the referral.

## 2017-09-05 ENCOUNTER — Encounter: Payer: Self-pay | Admitting: Hematology

## 2017-09-05 ENCOUNTER — Ambulatory Visit (HOSPITAL_BASED_OUTPATIENT_CLINIC_OR_DEPARTMENT_OTHER): Payer: 59

## 2017-09-05 ENCOUNTER — Telehealth: Payer: Self-pay | Admitting: Hematology

## 2017-09-05 ENCOUNTER — Ambulatory Visit (HOSPITAL_BASED_OUTPATIENT_CLINIC_OR_DEPARTMENT_OTHER): Payer: 59 | Admitting: Hematology

## 2017-09-05 ENCOUNTER — Other Ambulatory Visit (HOSPITAL_COMMUNITY)
Admission: RE | Admit: 2017-09-05 | Discharge: 2017-09-05 | Disposition: A | Discharge status: Home / Self Care | Payer: 59 | Source: Ambulatory Visit | Attending: Hematology | Admitting: Hematology

## 2017-09-05 VITALS — BP 137/62 | HR 55 | Temp 97.9°F | Resp 18 | Wt 211.0 lb

## 2017-09-05 DIAGNOSIS — D472 Monoclonal gammopathy: Secondary | ICD-10-CM | POA: Insufficient documentation

## 2017-09-05 DIAGNOSIS — D479 Neoplasm of uncertain behavior of lymphoid, hematopoietic and related tissue, unspecified: Secondary | ICD-10-CM | POA: Insufficient documentation

## 2017-09-05 DIAGNOSIS — D72820 Lymphocytosis (symptomatic): Secondary | ICD-10-CM

## 2017-09-05 DIAGNOSIS — G4730 Sleep apnea, unspecified: Secondary | ICD-10-CM | POA: Diagnosis not present

## 2017-09-05 LAB — CBC & DIFF AND RETIC
BASO%: 0.2 % (ref 0.0–2.0)
BASOS ABS: 0 10*3/uL (ref 0.0–0.1)
EOS%: 0.4 % (ref 0.0–7.0)
Eosinophils Absolute: 0.1 10*3/uL (ref 0.0–0.5)
HEMATOCRIT: 41.9 % (ref 38.4–49.9)
HEMOGLOBIN: 14.5 g/dL (ref 13.0–17.1)
Immature Retic Fract: 6.4 % (ref 3.00–10.60)
LYMPH%: 71 % — ABNORMAL HIGH (ref 14.0–49.0)
MCH: 30.5 pg (ref 27.2–33.4)
MCHC: 34.6 g/dL (ref 32.0–36.0)
MCV: 88.2 fL (ref 79.3–98.0)
MONO#: 0.9 10*3/uL (ref 0.1–0.9)
MONO%: 4.8 % (ref 0.0–14.0)
NEUT#: 4.3 10*3/uL (ref 1.5–6.5)
NEUT%: 23.6 % — AB (ref 39.0–75.0)
PLATELETS: 205 10*3/uL (ref 140–400)
RBC: 4.75 10*6/uL (ref 4.20–5.82)
RDW: 13.1 % (ref 11.0–14.6)
Retic %: 1.04 % (ref 0.80–1.80)
Retic Ct Abs: 49.4 10*3/uL (ref 34.80–93.90)
WBC: 18.3 10*3/uL — ABNORMAL HIGH (ref 4.0–10.3)
lymph#: 13 10*3/uL — ABNORMAL HIGH (ref 0.9–3.3)
nRBC: 0 % (ref 0–0)

## 2017-09-05 LAB — COMPREHENSIVE METABOLIC PANEL
ALBUMIN: 4.3 g/dL (ref 3.5–5.0)
ALT: 27 U/L (ref 0–55)
ANION GAP: 9 meq/L (ref 3–11)
AST: 24 U/L (ref 5–34)
Alkaline Phosphatase: 54 U/L (ref 40–150)
BUN: 23.9 mg/dL (ref 7.0–26.0)
CALCIUM: 9.6 mg/dL (ref 8.4–10.4)
CHLORIDE: 110 meq/L — AB (ref 98–109)
CO2: 21 meq/L — AB (ref 22–29)
Creatinine: 0.9 mg/dL (ref 0.7–1.3)
GLUCOSE: 81 mg/dL (ref 70–140)
POTASSIUM: 3.8 meq/L (ref 3.5–5.1)
Sodium: 141 mEq/L (ref 136–145)
Total Bilirubin: 0.41 mg/dL (ref 0.20–1.20)
Total Protein: 7.4 g/dL (ref 6.4–8.3)

## 2017-09-05 LAB — CHCC SMEAR

## 2017-09-05 LAB — TECHNOLOGIST REVIEW

## 2017-09-05 LAB — LACTATE DEHYDROGENASE: LDH: 195 U/L (ref 125–245)

## 2017-09-05 NOTE — Progress Notes (Signed)
HEMATOLOGY/ONCOLOGY CONSULTATION NOTE  Date of Service: 09/05/2017  Patient Care Team: Burnard Bunting, MD as PCP - General (Internal Medicine)  CHIEF COMPLAINTS/PURPOSE OF CONSULTATION:  Progressive lymphocytosis with increase in fatigue, rule out lymphoproliferative disorder   HISTORY OF PRESENTING ILLNESS:   Aaron Diaz is a wonderful 46 y.o. male who has been referred to [place] by his PCP Dr. Reynaldo Minium with [facility] for evaluation and management of new onset elevated WBC count. Pt was evaluated by Dr. Reynaldo Minium on 06/14/2017 for a routine follow-up during that visit, he was found to have elevated WBC count of 20.27k.    Pt presents to the office today accompanied by his wife. He reports that he is doing well overall. Pt was evaluated by his PCP. Pt states that he typically has nl CBC labs with his first abnormal lab findings (elevated WBC count) being 6 months ago at [facility]. 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 He reports that there was a training session completed 2 weeks ago where he volunteered to have his blood drawn and placed as the reference range for the lab. Pt states that at that time his CBC showed that his WBC count was elevated to 16.8k.   He states that he was evaluated by his PCP recently and had an elevated CBC at 20.2k as of 06/14/2017. He also noted atypical lymphs and notes that no smears were obtained at this time. Pt notes that he also had the Epstein barr and CMV labs drawn and is awaiting results. Pt reports that her was noted to be PPD positive about 25 yrs ago and was treated with INH for the positive PPD test. He denies hx of blood transfusions or prior accidental needle sticks.    Pt reports that he has changed his diet to increase veggie and nuts intake. Pt states he has been retired from [employer] for 2 years and reports that he's not sure if he has had any exposures to chemicals while in [place].   On review of systems, pt reports muscle  aches, fatigue x 3-4 weeks prior to the most recent lab draw, exertional SOB, increased snoring, nocturia, bowel changes x 3-4 weeks (intermittent and "all at once" when he has a bowel movement).  He denies mucus or blood in his stools, fever, chills, night sweats, weight loss, skin rashes, back pain and any other accompanying symptoms.   No recent sick contacts. No h/o overt body fluid exposures.  MEDICAL HISTORY:   HTN HLD Erectile Dysfunction  SURGICAL HISTORY: Knee surgery 2010 Cleft collar bone surgery 2015  SOCIAL HISTORY: Social History   Social History  . Marital status: Married    Spouse name: N/A  . Number of children: N/A  . Years of education: N/A   Occupational History  . Not on file.   Social History Main Topics  . Smoking status: Never Smoker  . Smokeless tobacco: Not on file  . Alcohol use Not on file  . Drug use: Unknown  . Sexual activity: Not on file   Other Topics Concern  . Not on file   Social History Narrative  . 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  FAMILY HISTORY: Mother died of lung cancer @ age 25yr. Had DM.  ALLERGIES:  has no allergies on file.  MEDICATIONS:  Current Outpatient Prescriptions  Medication Sig Dispense Refill  . Omega-3 Fatty Acids (FISH OIL)  1000 MG CAPS Take 2,000 mg by mouth daily.    . tamsulosin (FLOMAX) 0.4 MG CAPS capsule Take 0.8 mg by mouth daily.    . verapamil (CALAN-SR) 240 MG CR tablet Take 240 mg by mouth daily.    . Vitamin D, Cholecalciferol, 1000 units CAPS Take 5,000 Units by mouth daily.     No current facility-administered medications for this visit.     REVIEW OF SYSTEMS:    10 Point review of Systems was done is negative except as noted above.  PHYSICAL EXAMINATION:  ECOG PERFORMANCE STATUS: 0 - Asymptomatic  . Vitals:   09/05/17 1431  BP: 137/62  Pulse: (!) 55  Resp: 18  Temp: 97.9 F (36.6 C)  SpO2:  97%   Filed Weights   09/05/17 1431  Weight: 211 lb (95.7 kg)   .There is no height or weight on file to calculate BMI.  GENERAL:alert, in no acute distress and comfortable SKIN: no acute rashes, no significant lesions EYES: conjunctiva are pink and non-injected, sclera anicteric OROPHARYNX: MMM, no exudates, no oropharyngeal erythema or ulceration NECK: supple, no JVD LYMPH:  no palpable lymphadenopathy in the cervical, axillary or inguinal regions LUNGS: clear to auscultation b/l with normal respiratory effort HEART: regular rate & rhythm ABDOMEN:  normoactive bowel sounds , non tender, not distended. Extremity: no pedal edema PSYCH: alert & oriented x 3 with fluent speech NEURO: no focal motor/sensory deficits  LABORATORY DATA:  I have reviewed the data as listed  . CBC Latest Ref Rng & Units 09/05/2017 11/03/2008 11/03/2008  WBC 4.0 - 10.3 10e3/uL 18.3(H) - 7.9  Hemoglobin 13.0 - 17.1 g/dL 14.5 16.0 15.3  Hematocrit 38.4 - 49.9 % 41.9 47.0 45.8  Platelets 140 - 400 10e3/uL 205 - 249   . CBC    Component Value Date/Time   WBC 18.3 (H) 09/05/2017 1530   WBC 7.9 11/03/2008 1430   RBC 4.75 09/05/2017 1530   RBC 5.15 11/03/2008 1430   HGB 14.5 09/05/2017 1530   HCT 41.9 09/05/2017 1530   PLT 205 09/05/2017 1530   MCV 88.2 09/05/2017 1530   MCH 30.5 09/05/2017 1530   MCHC 34.6 09/05/2017 1530   MCHC 33.3 11/03/2008 1430   RDW 13.1 09/05/2017 1530   LYMPHSABS 13.0 (H) 09/05/2017 1530   MONOABS 0.9 09/05/2017 1530   EOSABS 0.1 09/05/2017 1530   BASOSABS 0.0 09/05/2017 1530    . CMP Latest Ref Rng & Units 09/05/2017 09/05/2017 11/03/2008  Glucose 70 - 140 mg/dl 81 - 111(H)  BUN 7.0 - 26.0 mg/dL 23.9 - 15  Creatinine 0.7 - 1.3 mg/dL 0.9 - 0.9  Sodium 136 - 145 mEq/L 141 - 140  Potassium 3.5 - 5.1 mEq/L 3.8 - 3.5  Chloride 96 - 112 mEq/L - - 106  CO2 22 - 29 mEq/L 21(L) - -  Calcium 8.4 - 10.4 mg/dL 9.6 - -  Total Protein 6.0 - 8.5 g/dL 7.4 6.7 -  Total Bilirubin  0.20 - 1.20 mg/dL 0.41 - -  Alkaline Phos 40 - 150 U/L 54 - -  AST 5 - 34 U/L 24 - -  ALT 0 - 55 U/L 27 - -       Component     Latest Ref Rng & Units 09/05/2017  LDH     125 - 245 U/L 195  Sed Rate     0 - 15 mm/hr 7  Hep C Virus Ab     0.0 - 0.9 s/co ratio <0.1  Hepatitis B Surface Ag     Negative Negative  Hep B Core Ab, Tot     Negative Negative  EBV VCA IgM     0.0 - 35.9 U/mL 37.4 (H)  EBV VCA IgG     0.0 - 17.9 U/mL >600.0 (H)  CMV Ab - IgG     0.00 - 0.59 U/mL <0.60  CMV IgM Ser EIA-aCnc     0.0 - 29.9 AU/mL <30.0     RADIOGRAPHIC STUDIES: I have personally reviewed the radiological images as listed and agreed with the findings in the report. No results found.  ASSESSMENT & PLAN:    46 y.o. male with   1) Progressive Lymphocytosis for atleast 6 months. Associated with some fatigue especially over the last 3-4 weeks and some bowel changes. No associated anemia or thrombocytopenia. No overt fevers/chills/drenching nightsweats. Normal LDH. PBS - shows lymphocytosis with small mature appearing lymphocytes  Flow cytometry - shows 79% of lymphocytes are clonal and suggestive of a CD5+ lymphoproliferative disorder -- CLL vs Mantle cell lymphoma.  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 Related to lymphoproliferative disorder. 3) Borderline elevated EBV IgM (unclear significance) and elevated IgG +ve ? Resolving EBV infection.   PLAN -patient was interviewed and labs workup was ordered as noted below for evaluation. -peripheral blood smear and all lab results were reviewed. -given concern for CD5+ lymphoproliferative disorder - will order t(11;14) on peripheral blood to r/o mantle cell lymphoma and CLL prognostic FISH panel. -PET/CT to evaluate for LNadenopathy and splenomegaly and to direct additional diagnostic workup and target for possible LN biopsy  RTC with Dr Irene Limbo in 10-12 days with PET/CT and with labs results  . Orders Placed  This Encounter  Procedures  . NM PET Image Initial (PI) Skull Base To Thigh    Standing Status:   Future    Standing Expiration Date:   09/08/2018    Order Specific Question:   If indicated for the ordered procedure, I authorize the administration of a radiopharmaceutical per Radiology protocol    Answer:   Yes    Order Specific Question:   Preferred imaging location?    Answer:   [facility]    Order Specific Question:   Radiology Contrast Protocol - do NOT remove file path    Answer:   [email or web address]    Order Specific Question:   Reason for Exam additional comments    Answer:   NHL ?mantle cell  to evaluate for LNadenopathy and splenomegaly and to direct additional diagnostic workup and target for possible LN biopsy  . CBC & Diff and Retic    Standing Status:   Future    Number of Occurrences:   1    Standing Expiration Date:   09/05/2018  . Comprehensive metabolic panel    Standing Status:   Future    Number of Occurrences:   1    Standing Expiration Date:   09/05/2018  . Smear    Standing Status:   Future    Number of Occurrences:   1    Standing Expiration Date:   09/05/2018  . Lactate dehydrogenase    Standing Status:   Future    Number of Occurrences:   1    Standing Expiration Date:   09/05/2018  . Sedimentation rate    Standing Status:   Future    Number of Occurrences:   1    Standing Expiration Date:   09/05/2018  .  Hepatitis C antibody    Standing Status:   Future    Number of Occurrences:   1    Standing Expiration Date:   09/05/2018  . Hepatitis B surface antigen    Standing Status:   Future    Number of Occurrences:   1    Standing Expiration Date:   09/05/2018  . Hepatitis B core antibody, total    Standing Status:   Future    Number of Occurrences:   1    Standing Expiration Date:   09/05/2018  . Epstein-Barr virus VCA, IgM    Standing Status:   Future    Number of Occurrences:   1    Standing Expiration Date:   09/05/2018    . Epstein-Barr virus VCA, IgG    Standing Status:   Future    Number of Occurrences:   1    Standing Expiration Date:   09/05/2018  . CMV antibody, IgG (EIA)    Standing Status:   Future    Number of Occurrences:   1    Standing Expiration Date:   09/05/2018  . CMV IgM    Standing Status:   Future    Number of Occurrences:   1    Standing Expiration Date:   09/05/2018  . Flow Cytometry    Increasing lymphocytosis with fatigue -- r/o lymphoproliferative disorder    Standing Status:   Future    Number of Occurrences:   1    Standing Expiration Date:   09/05/2018  . Multiple Myeloma Panel (SPEP&IFE w/QIG)    Standing Status:   Future    Number of Occurrences:   1    Standing Expiration Date:   09/05/2018  . Kappa/lambda light chains    Standing Status:   Future    Number of Occurrences:   1    Standing Expiration Date:   10/10/2018  . FISH, Peripheral Blood    T(11;14) to r/o mantle cell lymphoma    Standing Status:   Future    Standing Expiration Date:   09/08/2018  . FISH, CLL Prognostic Panel    Standing Status:   Future    Standing Expiration Date:   09/08/2018    All of the patients questions were answered with apparent satisfaction. The patient knows to call the clinic with any problems, questions or concerns.  I spent 45 minutes counseling the patient face to face. The total time spent in the appointment was 60 minutes and more than 50% was on counseling and direct patient cares.    Sullivan Lone MD [place] AAHIVMS [facility] Hematology/Oncology Physician [facility]  (Office):       [phone] (Work cell):  [phone] (Fax):           [phone]  09/05/2017 2:04 PM   This document serves as a record of services personally performed by Sullivan Lone, MD. It was created on her behalf by Steva Colder, a trained medical scribe. The creation of this record is based on the scribe's personal observations and the provider's statements to them. This document has been  checked and approved by the attending provider.

## 2017-09-05 NOTE — Patient Instructions (Signed)
Thank you for choosing [facility] to provide your oncology and hematology care.  To afford each patient quality time with our providers, please arrive 30 minutes before your scheduled appointment time.  If you arrive late for your appointment, you may be asked to reschedule.  We strive to give you quality time with our providers, and arriving late affects you and other patients whose appointments are after yours.   If you are a no show for multiple scheduled visits, you may be dismissed from the clinic at the providers discretion.    Again, thank you for choosing [facility], our hope is that these requests will decrease the amount of time that you wait before being seen by our physicians.  ______________________________________________________________________  Should you have questions after your visit to the [facility], please contact our office at [phone] between the hours of 8:30 and 4:30 p.m.    Voicemails left after 4:30p.m will not be returned until the following business day.    For prescription refill requests, please have your pharmacy contact us directly.  Please also try to allow 48 hours for prescription requests.    Please contact the scheduling department for questions regarding scheduling.  For scheduling of procedures such as PET scans, CT scans, MRI, Ultrasound, etc please contact central scheduling at [phone].    Resources For Cancer Patients and Caregivers:   Oncolink.org:  A wonderful resource for patients and healthcare providers for information regarding your disease, ways to tract your treatment, what to expect, etc.     American Cancer Society:  800-227-2345  Can help patients locate various types of support and financial assistance  Cancer Care: 1-800-813-HOPE (4673) Provides financial assistance, online support groups, medication/co-pay assistance.    [place] DSS:  336-641-3447 Where to apply for food  stamps, Medicaid, and utility assistance  Medicare Rights Center: 800-333-4114 Helps people with Medicare understand their rights and benefits, navigate the Medicare system, and secure the quality healthcare they deserve  SCAT: 336-333-6589 [place] [employer]'s shared-ride transportation service for eligible riders who have a disability that prevents them from riding the fixed route bus.    For additional information on assistance programs please contact our social worker:   Grier Hock/Abigail Elmore:  [phone]            

## 2017-09-05 NOTE — Telephone Encounter (Signed)
Gave patient avs report and appointments for October  °

## 2017-09-06 LAB — SEDIMENTATION RATE: Sedimentation Rate-Westergren: 7 mm/hr (ref 0–15)

## 2017-09-06 LAB — KAPPA/LAMBDA LIGHT CHAINS
Ig Kappa Free Light Chain: 13.5 mg/L (ref 3.3–19.4)
Ig Lambda Free Light Chain: 11.3 mg/L (ref 5.7–26.3)
KAPPA/LAMBDA FLC RATIO: 1.19 (ref 0.26–1.65)

## 2017-09-06 LAB — HEPATITIS C ANTIBODY: Hep C Virus Ab: <0.1 s/co ratio (ref 0.0–0.9)

## 2017-09-06 LAB — CMV IGM

## 2017-09-06 LAB — HEPATITIS B SURFACE ANTIGEN: HBsAg Screen: NEGATIVE

## 2017-09-06 LAB — CMV ANTIBODY, IGG (EIA): CMV Ab - IgG: <0.6 U/mL (ref 0.00–0.59)

## 2017-09-06 LAB — EPSTEIN-BARR VIRUS VCA, IGM: EBV VCA IgM: 37.4 U/mL — ABNORMAL HIGH (ref 0.0–35.9)

## 2017-09-06 LAB — EPSTEIN-BARR VIRUS VCA, IGG

## 2017-09-06 LAB — HEPATITIS B CORE ANTIBODY, TOTAL: Hep B Core Ab, Tot: NEGATIVE

## 2017-09-07 LAB — MULTIPLE MYELOMA PANEL, SERUM
ALBUMIN SERPL ELPH-MCNC: 3.8 g/dL (ref 2.9–4.4)
ALBUMIN/GLOB SERPL: 1.4 (ref 0.7–1.7)
Alpha 1: 0.2 g/dL (ref 0.0–0.4)
Alpha2 Glob SerPl Elph-Mcnc: 0.6 g/dL (ref 0.4–1.0)
B-GLOBULIN SERPL ELPH-MCNC: 1.1 g/dL (ref 0.7–1.3)
GAMMA GLOB SERPL ELPH-MCNC: 1 g/dL (ref 0.4–1.8)
GLOBULIN, TOTAL: 2.9 g/dL (ref 2.2–3.9)
IGA/IMMUNOGLOBULIN A, SERUM: 232 mg/dL (ref 90–386)
IgG, Qn, Serum: 1049 mg/dL (ref 700–1600)
IgM, Qn, Serum: 88 mg/dL (ref 20–172)
Total Protein: 6.7 g/dL (ref 6.0–8.5)

## 2017-09-08 ENCOUNTER — Ambulatory Visit (HOSPITAL_BASED_OUTPATIENT_CLINIC_OR_DEPARTMENT_OTHER): Payer: 59

## 2017-09-08 ENCOUNTER — Other Ambulatory Visit (HOSPITAL_COMMUNITY)
Admission: RE | Admit: 2017-09-08 | Discharge: 2017-09-08 | Disposition: A | Discharge status: Home / Self Care | Payer: 59 | Source: Ambulatory Visit | Attending: Hematology | Admitting: Hematology

## 2017-09-08 DIAGNOSIS — D72820 Lymphocytosis (symptomatic): Secondary | ICD-10-CM

## 2017-09-08 DIAGNOSIS — C9110 Chronic lymphocytic leukemia of B-cell type not having achieved remission: Secondary | ICD-10-CM

## 2017-09-08 DIAGNOSIS — Z1509 Genetic susceptibility to other malignant neoplasm: Secondary | ICD-10-CM | POA: Diagnosis not present

## 2017-09-08 DIAGNOSIS — D479 Neoplasm of uncertain behavior of lymphoid, hematopoietic and related tissue, unspecified: Secondary | ICD-10-CM | POA: Diagnosis not present

## 2017-09-08 DIAGNOSIS — D472 Monoclonal gammopathy: Secondary | ICD-10-CM | POA: Insufficient documentation

## 2017-09-08 LAB — FLOW CYTOMETRY

## 2017-09-12 ENCOUNTER — Telehealth: Payer: Self-pay

## 2017-09-12 NOTE — Telephone Encounter (Signed)
Pt called earlier concerning out-of-pocket expense for PET scan. Questioned necessity or ability to change imaging to another type. Spoke with Dr. Irene Limbo and he is okay at this time to d/c PET scan until results received from [facility] studies in pathology pinpointing disease type.  Dr. Irene Limbo will re-evaluate necessity of test based on those results. Pt called and aware of plan at this time. Pt to expect call back Friday or prior to communicate results and further testing that may be required. Pt verbalized understanding. Appointment for PET and doctor visit cancelled for 10/15.   To be rescheduled later.

## 2017-09-14 NOTE — Progress Notes (Signed)
HEMATOLOGY/ONCOLOGY CONSULTATION NOTE  Date of Service: 09/18/2017  Patient Care Team: Aaron Bunting, MD as PCP - General (Internal Medicine)  CHIEF COMPLAINTS/PURPOSE OF CONSULTATION:  F/u for newly diagnosed CLL   HISTORY OF PRESENTING ILLNESS:   Aaron Diaz is a wonderful 46 y.o. male who has been referred to [place] by his PCP Dr. Reynaldo Diaz with [facility] for evaluation and management of new onset elevated WBC count. Pt was evaluated by Dr. Reynaldo Diaz on 06/14/2017 for a routine follow-up during that visit, he was found to have elevated WBC count of 20.27k.    Pt presents to the office today accompanied by his wife. He reports that he is doing well overall. Pt was evaluated by his PCP. Pt states that he typically has nl CBC labs with his first abnormal lab findings (elevated WBC count) being 6 months ago at [facility]. 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 He reports that there was a training session completed 2 weeks ago where he volunteered to have his blood drawn and placed as the reference range for the lab. Pt states that at that time his CBC showed that his WBC count was elevated to 16.8k.   He states that he was evaluated by his PCP recently and had an elevated CBC at 20.2k as of 06/14/2017. He also noted atypical lymphs and notes that no smears were obtained at this time. Pt notes that he also had the Epstein barr and CMV labs drawn and is awaiting results. Pt reports that her was noted to be PPD positive about 25 yrs ago and was treated with INH for the positive PPD test. He denies hx of blood transfusions or prior accidental needle sticks.    Pt reports that he has changed his diet to increase veggie and nuts intake. Pt states he has been retired from [employer] for 2 years and reports that he's not sure if he has had any exposures to chemicals while in [place].   On review of systems, pt reports muscle aches, fatigue x 3-4 weeks prior to the most recent lab draw,  exertional SOB, increased snoring, nocturia, bowel changes x 3-4 weeks (intermittent and "all at once" when he has a bowel movement).  He denies mucus or blood in his stools, fever, chills, night sweats, weight loss, skin rashes, back pain and any other accompanying symptoms.   No recent sick contacts. No h/o overt body fluid exposures.   INTERVAL HISTORY:   Aaron Diaz is here for a follow up of progressive lymphocytosis. He is accompanied by his wife today.  He notes his chest has been feeling pressure and a cough starting a week ago. His cough is non productive and dry. He felt fatigued and SOB upon exertion. It has since improved. He has a history of exercise induced asthma. He still has an albuterol inhaler when he needs it, which is not often. He has not had any chest or lung work up or function test in several years. He notes having intermittent "sticky" stool with mucous, no blood, not formed. This started after he returned from [place]. He denies having any pets. He notices this stool change after eating cheese and certain dairy and overall change in diet.  His fatigue and current energy level is not usual for him. He normally works out and stays active. He spoke with his PCP about this. He is getting a sleep apnea test at the [facility] tomorrow. In the past few months his sleep apnea  has been exacerbated. He sleeps on his stomach but still experiences this. The lack of sleep, no energy and fatigue is not usually for him. He also has been waking up in the middle of the night to urinate. This is in addition to enlarged prostate that he uses Flomax for. He denies any spinal pain and feels uncomfortable upon palpation of abdomen currently.  We discussed the results of his flowcytometry, peripheral blood FISH studies in details. We discussed his new diagnosis of 11q mutated CLL.   MEDICAL HISTORY:  HTN HLD Erectile Dysfunction  SURGICAL HISTORY: Knee surgery 2010 Cleft collar bone surgery  2015  SOCIAL HISTORY: Social History   Social History  . Marital status: Married    Spouse name: N/A  . Number of children: N/A  . Years of education: N/A   Occupational History  . Not on file.   Social History Main Topics  . Smoking status: Never Smoker  . Smokeless tobacco: Never Used  . Alcohol use No  . Drug use: No  . Sexual activity: Not on file   Other Topics Concern  . Not on file   Social History Narrative  . 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  FAMILY HISTORY: Mother died of lung cancer @ age 51yrs. Had DM.  ALLERGIES:  has No Known Allergies.  MEDICATIONS:  Current Outpatient Prescriptions  Medication Sig Dispense Refill  . Omega-3 Fatty Acids (FISH OIL) 1000 MG CAPS Take 2,000 mg by mouth daily.    . tamsulosin (FLOMAX) 0.4 MG CAPS capsule Take 0.8 mg by mouth daily.    . verapamil (CALAN-SR) 240 MG CR tablet Take 240 mg by mouth daily.    . Vitamin D, Cholecalciferol, 1000 units CAPS Take 5,000 Units by mouth daily.     No current facility-administered medications for this visit.     REVIEW OF SYSTEMS:    10 Point review of Systems was done is negative except as noted above.  PHYSICAL EXAMINATION:  ECOG PERFORMANCE STATUS: 0 - Asymptomatic  . Vitals:   09/18/17 1424  BP: 112/78  Pulse: 60  Resp: 18  Temp: 98.2 F (36.8 C)  SpO2: 98%   Filed Weights   09/18/17 1424  Weight: 213 lb 9.6 oz (96.9 kg)   .Body mass index is 31.32 kg/m.  GENERAL:alert, in no acute distress and comfortable SKIN: no acute rashes, no significant lesions EYES: conjunctiva are pink and non-injected, sclera anicteric OROPHARYNX: MMM, no exudates, no oropharyngeal erythema or ulceration NECK: supple, no JVD LYMPH:  no palpable lymphadenopathy in the cervical, axillary or inguinal regions LUNGS: clear to auscultation b/l with normal respiratory effort HEART: regular rate &  rhythm ABDOMEN:  normoactive bowel sounds , non tender, not distended. Extremity: no pedal edema PSYCH: alert & oriented x 3 with fluent speech NEURO: no focal motor/sensory deficits  LABORATORY DATA:  I have reviewed the data as listed  . CBC Latest Ref Rng & Units 09/05/2017 11/03/2008 11/03/2008  WBC 4.0 - 10.3 10e3/uL 18.3(H) - 7.9  Hemoglobin 13.0 - 17.1 g/dL 14.5 16.0 15.3  Hematocrit 38.4 - 49.9 % 41.9 47.0 45.8  Platelets 140 - 400 10e3/uL 205 - 249   . CBC    Component Value Date/Time   WBC 18.3 (H) 09/05/2017 1530   WBC 7.9 11/03/2008 1430   RBC 4.75 09/05/2017 1530   RBC 5.15 11/03/2008 1430   HGB 14.5 09/05/2017 1530  HCT 41.9 09/05/2017 1530   PLT 205 09/05/2017 1530   MCV 88.2 09/05/2017 1530   MCH 30.5 09/05/2017 1530   MCHC 34.6 09/05/2017 1530   MCHC 33.3 11/03/2008 1430   RDW 13.1 09/05/2017 1530   LYMPHSABS 13.0 (H) 09/05/2017 1530   MONOABS 0.9 09/05/2017 1530   EOSABS 0.1 09/05/2017 1530   BASOSABS 0.0 09/05/2017 1530    . CMP Latest Ref Rng & Units 09/05/2017 09/05/2017 11/03/2008  Glucose 70 - 140 mg/dl 81 - 111(H)  BUN 7.0 - 26.0 mg/dL 23.9 - 15  Creatinine 0.7 - 1.3 mg/dL 0.9 - 0.9  Sodium 136 - 145 mEq/L 141 - 140  Potassium 3.5 - 5.1 mEq/L 3.8 - 3.5  Chloride 96 - 112 mEq/L - - 106  CO2 22 - 29 mEq/L 21(L) - -  Calcium 8.4 - 10.4 mg/dL 9.6 - -  Total Protein 6.0 - 8.5 g/dL 7.4 6.7 -  Total Bilirubin 0.20 - 1.20 mg/dL 0.41 - -  Alkaline Phos 40 - 150 U/L 54 - -  AST 5 - 34 U/L 24 - -  ALT 0 - 55 U/L 27 - -   . Lab Results  Component Value Date   LDH 195 09/05/2017        Component     Latest Ref Rng & Units 09/05/2017  LDH     125 - 245 U/L 195  Sed Rate     0 - 15 mm/hr 7  Hep C Virus Ab     0.0 - 0.9 s/co ratio <0.1  Hepatitis B Surface Ag     Negative Negative  Hep B Core Ab, Tot     Negative Negative  EBV VCA IgM     0.0 - 35.9 U/mL 37.4 (H)  EBV VCA IgG     0.0 - 17.9 U/mL >600.0 (H)  CMV Ab - IgG     0.00 -  0.59 U/mL <0.60  CMV IgM Ser EIA-aCnc     0.0 - 29.9 AU/mL <30.0       RADIOGRAPHIC STUDIES: I have personally reviewed the radiological images as listed and agreed with the findings in the report. No results found.  ASSESSMENT & PLAN:    46 y.o. male with   1) Newly diagnosed Chronic lymphocytic leukemia with 11q deletion. T(11;14) FISH neg - ruling out Mantle cell lymphoma.   Previously known as progressive Lymphocytosis for atleast 6 months. Associated with some fatigue especially over the last 3-4 weeks. No associated anemia or thrombocytopenia. No overt fevers/chills/drenching nightsweats. Normal LDH. 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 Related to lymphoproliferative disorder. 3) Borderline elevated EBV IgM (unclear significance) and elevated IgG +ve ? Resolving EBV infection.   PLAN -His 09/05/17 lab workup shows leukocytes are growing at a chronic slow moving proliferation. His further workup shows evidence of CLL. He has a 11q mutation which often suggests a more aggressive course and potentially a shorter time to treatment. -We plan to get a CT CAP to evaluate for LNadenopathy and splenomegaly to staging his newly diagnosed CLL -No palpable LNadenopathy or splenomegaly upon physical exam today.  -For now we will monitor closely with repeat labs and scans . No clear indication for treatment of the newly diagnosed CLL at this time. -Provided print out of CLL information and treatment options.   #4 "Sticky" Stool change, intermittent.- possible from food intolerance. Will keep food diary.   #  5. Coughing, intermittent SOB upon exertion PLAN:  -Based on his symptoms and history of exercise induced asthma, I suggested PFTs -ECHO to evaluate for DOE/SOB -I recommend he keeps a food dairy and watches what he eats along with watching his BMs -Will monitor   Pulmonary function  testing in 1 week ECHO in 1 week CT chest/abd/pelvis in 1 week RTC with Dr Irene Limbo in 2 months with labs    . Orders Placed This Encounter  Procedures  . CT Chest W Contrast    Standing Status:   Future    Standing Expiration Date:   09/18/2018    Order Specific Question:   If indicated for the ordered procedure, I authorize the administration of contrast media per Radiology protocol    Answer:   Yes    Order Specific Question:   Preferred imaging location?    Answer:   [facility]    Order Specific Question:   Radiology Contrast Protocol - do NOT remove file path    Answer:   [email or web address]    Order Specific Question:   Reason for Exam additional comments    Answer:   Newly diagnosed CLL for staging and evaluation of shortness of breath  . CT Abdomen Pelvis W Contrast    Standing Status:   Future    Standing Expiration Date:   09/18/2018    Order Specific Question:   If indicated for the ordered procedure, I authorize the administration of contrast media per Radiology protocol    Answer:   Yes    Order Specific Question:   Preferred imaging location?    Answer:   [facility]    Order Specific Question:   Radiology Contrast Protocol - do NOT remove file path    Answer:   [email or web address]    Order Specific Question:   Reason for Exam additional comments    Answer:   Newly diagnosed CLL for staging with abdominal pain  . CBC & Diff and Retic    Standing Status:   Future    Standing Expiration Date:   09/18/2018  . Comprehensive metabolic panel    Standing Status:   Future    Standing Expiration Date:   09/18/2018  . Beta 2 microglobulin, serum    Standing Status:   Future    Standing Expiration Date:   09/18/2018  . Lactate dehydrogenase    Standing Status:   Future    Standing Expiration Date:   09/18/2018  . ECHOCARDIOGRAM COMPLETE    Shortness of breath/pre-chemotherapy    Standing Status:   Future     Standing Expiration Date:   12/19/2018    Order Specific Question:   Where should this test be performed    Answer:   [place]    Order Specific Question:   Perflutren DEFINITY (image enhancing agent) should be administered unless hypersensitivity or allergy exist    Answer:   Administer Perflutren    Order Specific Question:   Expected Date:    Answer:   1 week  . Pulmonary Function Test    Shortness of breath. Patient with reactive airway disease.    Standing Status:   Future    Standing Expiration Date:   09/18/2018    Order Specific Question:   Where should this test be performed?    Answer:   [facility]    Order Specific Question:   Full PFT: includes the following: basic spirometry, spirometry pre & post bronchodilator, diffusion capacity (  DLCO), lung volumes    Answer:   Full PFT    Order Specific Question:   MIP/MEP    Answer:   Yes    Order Specific Question:   6 minute walk    Answer:   Yes    Order Specific Question:   ABG    Answer:   No    Order Specific Question:   Diffusion capacity (DLCO)    Answer:   Yes    Order Specific Question:   Lung volumes    Answer:   Yes    Order Specific Question:   Methacholine challenge    Answer:   No    All of the patients questions were answered with apparent satisfaction. The patient knows to call the clinic with any problems, questions or concerns.  I spent 30 minutes counseling the patient face to face. The total time spent in the appointment was 40 minutes and more than 50% was on counseling and direct patient cares.    Sullivan Lone MD MS AAHIVMS [facility] Hematology/Oncology Physician [facility]  (Office):       [phone] (Work cell):  [phone] (Fax):           [phone]  09/18/2017 2:26 PM   This document serves as a record of services personally performed by Sullivan Lone, MD. It was created on her behalf by Joslyn Devon, a trained medical scribe. The creation of this record is based on the  scribe's personal observations and the provider's statements to them. This document has been checked and approved by the attending provider.

## 2017-09-18 ENCOUNTER — Telehealth: Payer: Self-pay | Admitting: Hematology

## 2017-09-18 ENCOUNTER — Ambulatory Visit (HOSPITAL_BASED_OUTPATIENT_CLINIC_OR_DEPARTMENT_OTHER): Payer: 59 | Admitting: Hematology

## 2017-09-18 ENCOUNTER — Ambulatory Visit: Payer: 59 | Admitting: Hematology

## 2017-09-18 ENCOUNTER — Ambulatory Visit (HOSPITAL_COMMUNITY): Payer: 59

## 2017-09-18 VITALS — BP 112/78 | HR 60 | Temp 98.2°F | Resp 18 | Wt 213.6 lb

## 2017-09-18 DIAGNOSIS — R0602 Shortness of breath: Secondary | ICD-10-CM

## 2017-09-18 DIAGNOSIS — C9110 Chronic lymphocytic leukemia of B-cell type not having achieved remission: Secondary | ICD-10-CM | POA: Diagnosis not present

## 2017-09-18 DIAGNOSIS — I10 Essential (primary) hypertension: Secondary | ICD-10-CM | POA: Diagnosis not present

## 2017-09-18 DIAGNOSIS — R109 Unspecified abdominal pain: Secondary | ICD-10-CM

## 2017-09-18 NOTE — Telephone Encounter (Signed)
Gave avs and calendar for December  °

## 2017-09-20 LAB — FISH, PERIPHERAL BLOOD

## 2017-09-22 LAB — FISH,CLL PROGNOSTIC PANEL

## 2017-09-25 ENCOUNTER — Ambulatory Visit (HOSPITAL_COMMUNITY): Payer: 59

## 2017-09-26 ENCOUNTER — Ambulatory Visit (HOSPITAL_COMMUNITY)
Admission: RE | Admit: 2017-09-26 | Discharge: 2017-09-26 | Disposition: A | Discharge status: Home / Self Care | Payer: 59 | Source: Ambulatory Visit | Attending: Hematology | Admitting: Hematology

## 2017-09-26 ENCOUNTER — Encounter (HOSPITAL_COMMUNITY): Payer: Self-pay

## 2017-09-26 DIAGNOSIS — R590 Localized enlarged lymph nodes: Secondary | ICD-10-CM | POA: Insufficient documentation

## 2017-09-26 DIAGNOSIS — R0602 Shortness of breath: Secondary | ICD-10-CM | POA: Diagnosis not present

## 2017-09-26 DIAGNOSIS — R109 Unspecified abdominal pain: Secondary | ICD-10-CM | POA: Diagnosis not present

## 2017-09-26 MED ORDER — IOPAMIDOL (ISOVUE-300) INJECTION 61%
INTRAVENOUS | Status: AC
  Administered 2017-09-26: 100 mL via INTRAVENOUS
  Filled 2017-09-26: qty 100

## 2017-09-26 MED ORDER — IOPAMIDOL (ISOVUE-300) INJECTION 61%
100.0000 mL | Freq: Once | INTRAVENOUS | Status: AC | PRN
  Administered 2017-09-26: 100 mL via INTRAVENOUS

## 2017-09-27 ENCOUNTER — Ambulatory Visit (HOSPITAL_COMMUNITY)
Admission: RE | Admit: 2017-09-27 | Discharge: 2017-09-27 | Disposition: A | Discharge status: Home / Self Care | Payer: 59 | Source: Ambulatory Visit | Attending: Hematology | Admitting: Hematology

## 2017-09-27 DIAGNOSIS — I119 Hypertensive heart disease without heart failure: Secondary | ICD-10-CM | POA: Diagnosis not present

## 2017-09-27 DIAGNOSIS — I071 Rheumatic tricuspid insufficiency: Secondary | ICD-10-CM | POA: Diagnosis not present

## 2017-09-27 DIAGNOSIS — R0602 Shortness of breath: Secondary | ICD-10-CM | POA: Diagnosis not present

## 2017-09-27 DIAGNOSIS — C9110 Chronic lymphocytic leukemia of B-cell type not having achieved remission: Secondary | ICD-10-CM | POA: Diagnosis not present

## 2017-09-27 LAB — PULMONARY FUNCTION TEST
DL/VA % PRED: 104 %
DL/VA: 4.88 ml/min/mmHg/L
DLCO UNC % PRED: 94 %
DLCO unc: 30.59 ml/min/mmHg
FEF 25-75 POST: 3.77 L/s
FEF 25-75 PRE: 3.96 L/s
FEF2575-%CHANGE-POST: -4 %
FEF2575-%PRED-POST: 104 %
FEF2575-%PRED-PRE: 110 %
FEV1-%Change-Post: 2 %
FEV1-%PRED-POST: 92 %
FEV1-%Pred-Pre: 90 %
FEV1-PRE: 3.63 L
FEV1-Post: 3.71 L
FEV1FVC-%CHANGE-POST: 0 %
FEV1FVC-%PRED-PRE: 107 %
FEV6-%CHANGE-POST: 1 %
FEV6-%PRED-PRE: 87 %
FEV6-%Pred-Post: 88 %
FEV6-Post: 4.41 L
FEV6-Pre: 4.33 L
FEV6FVC-%Change-Post: 0 %
FEV6FVC-%Pred-Post: 103 %
FEV6FVC-%Pred-Pre: 103 %
FVC-%CHANGE-POST: 1 %
FVC-%PRED-POST: 85 %
FVC-%PRED-PRE: 84 %
FVC-POST: 4.41 L
FVC-PRE: 4.33 L
POST FEV1/FVC RATIO: 84 %
PRE FEV1/FVC RATIO: 84 %
Post FEV6/FVC ratio: 100 %
Pre FEV6/FVC Ratio: 100 %
RV % pred: 105 %
RV: 2.08 L
TLC % pred: 97 %
TLC: 6.78 L

## 2017-09-27 MED ORDER — ALBUTEROL SULFATE (2.5 MG/3ML) 0.083% IN NEBU
2.5000 mg | INHALATION_SOLUTION | Freq: Once | RESPIRATORY_TRACT | Status: AC
  Administered 2017-09-27: 2.5 mg via RESPIRATORY_TRACT

## 2017-09-28 ENCOUNTER — Ambulatory Visit (HOSPITAL_BASED_OUTPATIENT_CLINIC_OR_DEPARTMENT_OTHER)
Admission: RE | Admit: 2017-09-28 | Discharge: 2017-09-28 | Disposition: A | Discharge status: Home / Self Care | Payer: 59 | Source: Ambulatory Visit | Attending: Hematology | Admitting: Hematology

## 2017-09-28 DIAGNOSIS — C9110 Chronic lymphocytic leukemia of B-cell type not having achieved remission: Secondary | ICD-10-CM | POA: Diagnosis not present

## 2017-09-28 DIAGNOSIS — R0602 Shortness of breath: Secondary | ICD-10-CM | POA: Diagnosis not present

## 2017-09-28 DIAGNOSIS — I119 Hypertensive heart disease without heart failure: Secondary | ICD-10-CM | POA: Diagnosis not present

## 2017-09-28 NOTE — Progress Notes (Signed)
  Echocardiogram 2D Echocardiogram has been performed.  Tresa Res 09/28/2017, 8:49 AM

## 2017-09-29 ENCOUNTER — Telehealth: Payer: Self-pay

## 2017-09-29 LAB — TISSUE HYBRIDIZATION TO NCBH

## 2017-09-29 NOTE — Telephone Encounter (Signed)
Pt called requesting results of CT, echo, and PFTs performed this week. Staff message sent to Dr. Irene Limbo and Delle Reining, RN to review and respond to patient with results on Monday. Pt called to let him know testing and imaging has resulted. Dr. Irene Limbo to review and get back in touch via Delle Reining, RN next week.

## 2017-10-02 ENCOUNTER — Telehealth: Payer: Self-pay | Admitting: Hematology

## 2017-10-02 NOTE — Telephone Encounter (Signed)
Called patient and discussed CT, ECHO PFT results and answered his questions. Next clinic f/u in 11/2017. Brunetta Genera

## 2017-10-10 ENCOUNTER — Telehealth: Payer: Self-pay

## 2017-10-10 NOTE — Telephone Encounter (Signed)
Dawn Therapist, sports at [employer] called for medical records of pt we referred. Unable to get through to HIM. Records faxed to [phone].

## 2017-10-12 DIAGNOSIS — C9110 Chronic lymphocytic leukemia of B-cell type not having achieved remission: Secondary | ICD-10-CM | POA: Diagnosis not present

## 2017-10-18 ENCOUNTER — Telehealth: Payer: Self-pay | Admitting: Hematology

## 2017-10-18 NOTE — Telephone Encounter (Signed)
Patient called in to cancel his appointment. He did not want to reschedule at this time.

## 2017-10-23 ENCOUNTER — Telehealth: Payer: Self-pay | Admitting: *Deleted

## 2017-10-23 NOTE — Telephone Encounter (Signed)
I cancelled my 11-17-2017 11:00 am appointment for lab, Dr. Irene Limbo.  Need to reschedule.  Is that appointment available?  I need an appointment this month or next before the end of the year when my wife's insurance changes over.  I'm available anytime except 8:30 am 11-17-2017.  I adjust my work schedule around appointments. "  Voicemail with this request left for high priority scheduler.

## 2017-10-30 NOTE — Telephone Encounter (Signed)
No appointment at this time.  Scheduling message sent.

## 2017-11-01 ENCOUNTER — Telehealth: Payer: Self-pay | Admitting: Hematology

## 2017-11-01 NOTE — Telephone Encounter (Signed)
Spoke to patient regarding upcoming December appointments per 11/26 sch message.

## 2017-11-08 ENCOUNTER — Other Ambulatory Visit (HOSPITAL_BASED_OUTPATIENT_CLINIC_OR_DEPARTMENT_OTHER): Payer: 59

## 2017-11-08 ENCOUNTER — Telehealth: Payer: Self-pay

## 2017-11-08 ENCOUNTER — Ambulatory Visit (HOSPITAL_BASED_OUTPATIENT_CLINIC_OR_DEPARTMENT_OTHER): Payer: 59 | Admitting: Hematology

## 2017-11-08 VITALS — BP 117/74 | HR 61 | Temp 97.7°F | Resp 18 | Wt 212.1 lb

## 2017-11-08 DIAGNOSIS — C9190 Lymphoid leukemia, unspecified not having achieved remission: Secondary | ICD-10-CM

## 2017-11-08 DIAGNOSIS — C9110 Chronic lymphocytic leukemia of B-cell type not having achieved remission: Secondary | ICD-10-CM | POA: Diagnosis not present

## 2017-11-08 DIAGNOSIS — I10 Essential (primary) hypertension: Secondary | ICD-10-CM

## 2017-11-08 LAB — LACTATE DEHYDROGENASE: LDH: 197 U/L (ref 125–245)

## 2017-11-08 LAB — COMPREHENSIVE METABOLIC PANEL
ALBUMIN: 4.3 g/dL (ref 3.5–5.0)
ALT: 29 U/L (ref 0–55)
ANION GAP: 9 meq/L (ref 3–11)
AST: 24 U/L (ref 5–34)
Alkaline Phosphatase: 56 U/L (ref 40–150)
BUN: 17.8 mg/dL (ref 7.0–26.0)
CALCIUM: 9.5 mg/dL (ref 8.4–10.4)
CHLORIDE: 109 meq/L (ref 98–109)
CO2: 22 mEq/L (ref 22–29)
Creatinine: 1 mg/dL (ref 0.7–1.3)
Glucose: 105 mg/dl (ref 70–140)
POTASSIUM: 4.3 meq/L (ref 3.5–5.1)
SODIUM: 140 meq/L (ref 136–145)
TOTAL PROTEIN: 7.4 g/dL (ref 6.4–8.3)
Total Bilirubin: 0.52 mg/dL (ref 0.20–1.20)

## 2017-11-08 LAB — CBC & DIFF AND RETIC
BASO%: 0.4 % (ref 0.0–2.0)
Basophils Absolute: 0.1 10*3/uL (ref 0.0–0.1)
EOS%: 0.3 % (ref 0.0–7.0)
Eosinophils Absolute: 0.1 10*3/uL (ref 0.0–0.5)
HCT: 45.2 % (ref 38.4–49.9)
HGB: 14.8 g/dL (ref 13.0–17.1)
IMMATURE RETIC FRACT: 4.1 % (ref 3.00–10.60)
LYMPH#: 14.3 10*3/uL — AB (ref 0.9–3.3)
LYMPH%: 74.4 % — ABNORMAL HIGH (ref 14.0–49.0)
MCH: 29.3 pg (ref 27.2–33.4)
MCHC: 32.8 g/dL (ref 32.0–36.0)
MCV: 89.2 fL (ref 79.3–98.0)
MONO#: 0.7 10*3/uL (ref 0.1–0.9)
MONO%: 3.6 % (ref 0.0–14.0)
NEUT%: 21.3 % — ABNORMAL LOW (ref 39.0–75.0)
NEUTROS ABS: 4.1 10*3/uL (ref 1.5–6.5)
Platelets: 213 10*3/uL (ref 140–400)
RBC: 5.06 10*6/uL (ref 4.20–5.82)
RDW: 13 % (ref 11.0–14.6)
RETIC %: 1.21 % (ref 0.80–1.80)
Retic Ct Abs: 61.23 10*3/uL (ref 34.80–93.90)
WBC: 19.2 10*3/uL — AB (ref 4.0–10.3)

## 2017-11-08 LAB — TECHNOLOGIST REVIEW

## 2017-11-08 NOTE — Progress Notes (Signed)
HEMATOLOGY/ONCOLOGY CLINIC NOTE  Date of Service: 11/08/2017  Patient Care Team: Burnard Bunting, MD as PCP - General (Internal Medicine)  CHIEF COMPLAINTS/PURPOSE OF CONSULTATION:  F/u for newly diagnosed CLL   HISTORY OF PRESENTING ILLNESS:   Aaron Diaz is a wonderful 46 y.o. male who has been referred to [place] by his PCP Dr. Reynaldo Minium with [facility] for evaluation and management of new onset elevated WBC count. Pt was evaluated by Dr. Reynaldo Minium on 06/14/2017 for a routine follow-up during that visit, he was found to have elevated WBC count of 20.27k.    Pt presents to the office today accompanied by his wife. He reports that he is doing well overall. Pt was evaluated by his PCP. Pt states that he typically has nl CBC labs with his first abnormal lab findings (elevated WBC count) being 6 months ago at [facility]. 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 He reports that there was a training session completed 2 weeks ago where he volunteered to have his blood drawn and placed as the reference range for the lab. Pt states that at that time his CBC showed that his WBC count was elevated to 16.8k.   He states that he was evaluated by his PCP recently and had an elevated CBC at 20.2k as of 06/14/2017. He also noted atypical lymphs and notes that no smears were obtained at this time. Pt notes that he also had the Epstein barr and CMV labs drawn and is awaiting results. Pt reports that her was noted to be PPD positive about 25 yrs ago and was treated with INH for the positive PPD test. He denies hx of blood transfusions or prior accidental needle sticks.    Pt reports that he has changed his diet to increase veggie and nuts intake. Pt states he has been retired from [employer] for 2 years and reports that he's not sure if he has had any exposures to chemicals while in [place].   On review of systems, pt reports muscle aches, fatigue x 3-4 weeks prior to the most recent lab draw,  exertional SOB, increased snoring, nocturia, bowel changes x 3-4 weeks (intermittent and "all at once" when he has a bowel movement).  He denies mucus or blood in his stools, fever, chills, night sweats, weight loss, skin rashes, back pain and any other accompanying symptoms.   No recent sick contacts. No h/o overt body fluid exposures.   INTERVAL HISTORY:   Aaron Diaz is here for a follow up of hisnewly diagnosed CLL. Of note since his last visit, he's had an echocardiogram completed on 09/28/2017 with results showing normalEF of 55-60%.  He's had a CT ABD on 09/26/2017 with results showing: IMPRESSION: 1. Increased number of axillary and subpectoral lymph nodes with 1 enlarged right axillary lymph node at 13 mm short axis. This is associated with upper normal to borderline enlarged lymph nodes in the retroperitoneal space and common femoral regions. 2. No splenomegaly. 3. No acute findings in the abdomen or pelvis.   He states he is doing well overall. He is accompanied by his wife today. He reports he is still experiencing a dry non-productive cough and has not seen a pulmonologist for this. Labs are stable. He notes his grandson was born recently. He is trying to remain consistently physically active. He has received his flu shot/Prevnar and is scheduled to receive his Pneumococcal vaccine. He was recently diagnosed with sleep apnea and will pick up his CPAP on  the 15th and hopes that this might help his fatigue.  On review of systems, pt reports a non-productive cough, fatigue, pain to the left side of his chest-transient/resolved and denies fever, chills, night sweats, weight loss, SOB and any other accompanying symptoms.   MEDICAL HISTORY:  HTN HLD Erectile Dysfunction  SURGICAL HISTORY: Knee surgery 2010 Cleft collar bone surgery 2015  SOCIAL HISTORY: Social History   Socioeconomic History  . Marital status: Married    Spouse name: Not on file  . Number of children: Not on  file  . Years of education: Not on file  . Highest education level: Not on file  Social Needs  . Financial resource strain: Not on file  . Food insecurity - worry: Not on file  . Food insecurity - inability: Not on file  . Transportation needs - medical: Not on file  . Transportation needs - non-medical: Not on file  Occupational History  . Not on file  Tobacco Use  . Smoking status: Never Smoker  . Smokeless tobacco: Never Used  Substance and Sexual Activity  . Alcohol use: No  . Drug use: No  . Sexual activity: Not on file  Other Topics Concern  . Not on file  Social History Narrative  . 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  FAMILY HISTORY: Mother died of lung cancer @ age 36yrs. Had DM.  ALLERGIES:  has No Known Allergies.  MEDICATIONS:  Current Outpatient Medications  Medication Sig Dispense Refill  . Omega-3 Fatty Acids (FISH OIL) 1000 MG CAPS Take 2,000 mg by mouth daily.    . tamsulosin (FLOMAX) 0.4 MG CAPS capsule Take 0.8 mg by mouth daily.    . verapamil (CALAN-SR) 240 MG CR tablet Take 240 mg by mouth daily.    . Vitamin D, Cholecalciferol, 1000 units CAPS Take 5,000 Units by mouth daily.     No current facility-administered medications for this visit.     REVIEW OF SYSTEMS:    10 Point review of Systems was done is negative except as noted above.  PHYSICAL EXAMINATION:  ECOG PERFORMANCE STATUS: 0 - Asymptomatic  . Vitals:   11/08/17 1040  BP: 117/74  Pulse: 61  Resp: 18  Temp: 97.7 F (36.5 C)  SpO2: 97%   Filed Weights   11/08/17 1040  Weight: 212 lb 1.6 oz (96.2 kg)   .There is no height or weight on file to calculate BMI.  GENERAL:alert, in no acute distress and comfortable SKIN: no acute rashes, no significant lesions EYES: conjunctiva are pink and non-injected, sclera anicteric OROPHARYNX: MMM, no exudates, no oropharyngeal erythema or ulceration NECK: supple, no  JVD LYMPH:  no palpable lymphadenopathy in the cervical, axillary or inguinal regions LUNGS: clear to auscultation b/l with normal respiratory effort HEART: regular rate & rhythm ABDOMEN:  normoactive bowel sounds , non tender, not distended. Extremity: no pedal edema PSYCH: alert & oriented x 3 with fluent speech NEURO: no focal motor/sensory deficits  LABORATORY DATA:  I have reviewed the data as listed  . CBC Latest Ref Rng & Units 11/08/2017 09/05/2017 11/03/2008  WBC 4.0 - 10.3 10e3/uL 19.2(H) 18.3(H) -  Hemoglobin 13.0 - 17.1 g/dL 14.8 14.5 16.0  Hematocrit 38.4 - 49.9 % 45.2 41.9 47.0  Platelets 140 - 400 10e3/uL 213 205 -   . CBC    Component Value Date/Time   WBC 19.2 (H) 11/08/2017 0953   WBC 7.9  11/03/2008 1430   RBC 5.06 11/08/2017 0953   RBC 5.15 11/03/2008 1430   HGB 14.8 11/08/2017 0953   HCT 45.2 11/08/2017 0953   PLT 213 11/08/2017 0953   MCV 89.2 11/08/2017 0953   MCH 29.3 11/08/2017 0953   MCHC 32.8 11/08/2017 0953   MCHC 33.3 11/03/2008 1430   RDW 13.0 11/08/2017 0953   LYMPHSABS 14.3 (H) 11/08/2017 0953   MONOABS 0.7 11/08/2017 0953   EOSABS 0.1 11/08/2017 0953   BASOSABS 0.1 11/08/2017 0953    . CMP Latest Ref Rng & Units 11/08/2017 09/05/2017 09/05/2017  Glucose 70 - 140 mg/dl 105 81 -  BUN 7.0 - 26.0 mg/dL 17.8 23.9 -  Creatinine 0.7 - 1.3 mg/dL 1.0 0.9 -  Sodium 136 - 145 mEq/L 140 141 -  Potassium 3.5 - 5.1 mEq/L 4.3 3.8 -  Chloride 96 - 112 mEq/L - - -  CO2 22 - 29 mEq/L 22 21(L) -  Calcium 8.4 - 10.4 mg/dL 9.5 9.6 -  Total Protein 6.4 - 8.3 g/dL 7.4 7.4 6.7  Total Bilirubin 0.20 - 1.20 mg/dL 0.52 0.41 -  Alkaline Phos 40 - 150 U/L 56 54 -  AST 5 - 34 U/L 24 24 -  ALT 0 - 55 U/L 29 27 -   . Lab Results  Component Value Date   LDH 197 11/08/2017        Component     Latest Ref Rng & Units 09/05/2017  LDH     125 - 245 U/L 195  Sed Rate     0 - 15 mm/hr 7  Hep C Virus Ab     0.0 - 0.9 s/co ratio <0.1  Hepatitis B Surface  Ag     Negative Negative  Hep B Core Ab, Tot     Negative Negative  EBV VCA IgM     0.0 - 35.9 U/mL 37.4 (H)  EBV VCA IgG     0.0 - 17.9 U/mL >600.0 (H)  CMV Ab - IgG     0.00 - 0.59 U/mL <0.60  CMV IgM Ser EIA-aCnc     0.0 - 29.9 AU/mL <30.0       RADIOGRAPHIC STUDIES: I have personally reviewed the radiological images as listed and agreed with the findings in the report. No results found.  ASSESSMENT & PLAN:    46 y.o. male with   1) Newly diagnosed Chronic lymphocytic leukemia with 11q deletion. T(11;14) FISH neg - ruling out Mantle cell lymphoma.   Previously known as progressive Lymphocytosis for atleast 6 months. Associated with some fatigue especially over the last 3-4 weeks. No associated anemia or thrombocytopenia. No overt fevers/chills/drenching nightsweats. Normal LDH. 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 Related to lymphoproliferative disorder.  3) Borderline elevated EBV IgM (unclear significance) and elevated IgG +ve ? Resolving EBV infection.   PLAN -patient has no symptoms suggestive of CLL progression at this time and his labs are stable with no anemia/thrombocytopenia and minimally changed leucocytosis/lymphocytosis. -Pt noted to have received Prevnar and will receive pneumococcal vaccine in few days -If labs remain stable on next clinic visit, plan to space out appointments to q9months  #4 "Sticky" Stool change, intermittent.- possible from food intolerance. Will keep food diary.   #5. Coughing, intermittent SOB upon exertion Echocardiogram 09/28/2017 - nl EF Noted to have new diagnosis of sleep apnea ?exercise induced asthma - PFTs were WNL PLAN:  F/u with PCP  RTC with Dr Irene Limbo in 4 months with labs   Orders Placed This Encounter  Procedures  . CBC & Diff and Retic    Standing Status:   Future    Standing Expiration Date:   11/08/2018  .  Comprehensive metabolic panel    Standing Status:   Future    Standing Expiration Date:   11/08/2018  . Lactate dehydrogenase    Standing Status:   Future    Standing Expiration Date:   11/08/2018    All of the patients questions were answered with apparent satisfaction. The patient knows to call the clinic with any problems, questions or concerns.  I spent 20 minutes counseling the patient face to face. The total time spent in the appointment was 30 minutes and more than 50% was on counseling and direct patient cares.    Sullivan Lone MD [place] AAHIVMS [facility] Hematology/Oncology Physician [facility]  (Office):       [phone] (Work cell):  [phone] (Fax):           [phone]  11/08/2017 10:42 AM   This document serves as a record of services personally performed by Sullivan Lone, MD. It was created on his behalf by Alean Rinne, a trained medical scribe. The creation of this record is based on the scribe's personal observations and the provider's statements to them.   .I have reviewed the above documentation for accuracy and completeness, and I agree with the above. Brunetta Genera MD MS

## 2017-11-08 NOTE — Telephone Encounter (Signed)
Printed avs and calender for up coming appointment. Per 12/5 los 

## 2017-11-08 NOTE — Patient Instructions (Signed)
Thank you for choosing [facility] to provide your oncology and hematology care.  To afford each patient quality time with our providers, please arrive 30 minutes before your scheduled appointment time.  If you arrive late for your appointment, you may be asked to reschedule.  We strive to give you quality time with our providers, and arriving late affects you and other patients whose appointments are after yours.  If you are a no show for multiple scheduled visits, you may be dismissed from the clinic at the providers discretion.   Again, thank you for choosing [facility], our hope is that these requests will decrease the amount of time that you wait before being seen by our physicians.  ______________________________________________________________________ Should you have questions after your visit to the [facility], please contact our office at [phone] between the hours of 8:30 and 4:30 p.m.    Voicemails left after 4:30p.m will not be returned until the following business day.   For prescription refill requests, please have your pharmacy contact us directly.  Please also try to allow 48 hours for prescription requests.   Please contact the scheduling department for questions regarding scheduling.  For scheduling of procedures such as PET scans, CT scans, MRI, Ultrasound, etc please contact central scheduling at [phone].   Resources For Cancer Patients and Caregivers:  American Cancer Society:  800-227-2345  Can help patients locate various types of support and financial assistance Cancer Care: 1-800-813-HOPE (4673) Provides financial assistance, online support groups, medication/co-pay assistance.   [place] DSS:  336-641-3447 Where to apply for food stamps, Medicaid, and utility assistance Medicare Rights Center: 800-333-4114 Helps people with Medicare understand their rights and benefits, navigate the Medicare system, and secure the  quality healthcare they deserve SCAT: 336-333-6589 [place] [employer]'s shared-ride transportation service for eligible riders who have a disability that prevents them from riding the fixed route bus.   For additional information on assistance programs please contact our social worker:   Grier Hock/Abigail Elmore:  [phone] 

## 2017-11-09 LAB — BETA 2 MICROGLOBULIN, SERUM: Beta-2: 1.5 mg/L (ref 0.6–2.4)

## 2017-11-17 ENCOUNTER — Ambulatory Visit: Payer: 59 | Admitting: Hematology

## 2017-11-17 ENCOUNTER — Other Ambulatory Visit: Payer: 59

## 2017-12-28 DIAGNOSIS — C9110 Chronic lymphocytic leukemia of B-cell type not having achieved remission: Secondary | ICD-10-CM | POA: Diagnosis not present

## 2018-03-01 DIAGNOSIS — R5383 Other fatigue: Secondary | ICD-10-CM | POA: Diagnosis not present

## 2018-03-01 DIAGNOSIS — R590 Localized enlarged lymph nodes: Secondary | ICD-10-CM | POA: Diagnosis not present

## 2018-03-05 ENCOUNTER — Telehealth: Payer: Self-pay | Admitting: Hematology

## 2018-03-05 NOTE — Telephone Encounter (Signed)
Returned call to patient he requested to cancel his appt for 4/3 and would call back to reschedule/ per 4/1 phone msg

## 2018-03-07 ENCOUNTER — Other Ambulatory Visit: Payer: 59

## 2018-03-07 ENCOUNTER — Ambulatory Visit: Payer: 59 | Admitting: Hematology

## 2018-03-29 DIAGNOSIS — R5383 Other fatigue: Secondary | ICD-10-CM | POA: Diagnosis not present

## 2018-03-29 DIAGNOSIS — G4733 Obstructive sleep apnea (adult) (pediatric): Secondary | ICD-10-CM | POA: Diagnosis not present

## 2018-03-29 DIAGNOSIS — C9110 Chronic lymphocytic leukemia of B-cell type not having achieved remission: Secondary | ICD-10-CM | POA: Diagnosis not present

## 2018-04-04 IMAGING — CT CT CHEST W/ CM
2 of 5 series · 13 of 36 positions shown, 16 images · IV contrast (APPLIED)
Comparison: None.

CLINICAL DATA: CLL. Shortness of breath and left-sided abdominal
pain.

EXAM:
CT CHEST, ABDOMEN, AND PELVIS WITH CONTRAST
TECHNIQUE: Multidetector CT imaging of the chest, abdomen and pelvis was
performed following the standard protocol during bolus
administration of intravenous contrast.
CONTRAST:  100 cc [id number]

[Series 2: cap with · axial · 0.88mm/px · z∈[-810,-240]mm · 10 of 140 slices shown, 13 images]
[im 13/140  mediastinal]
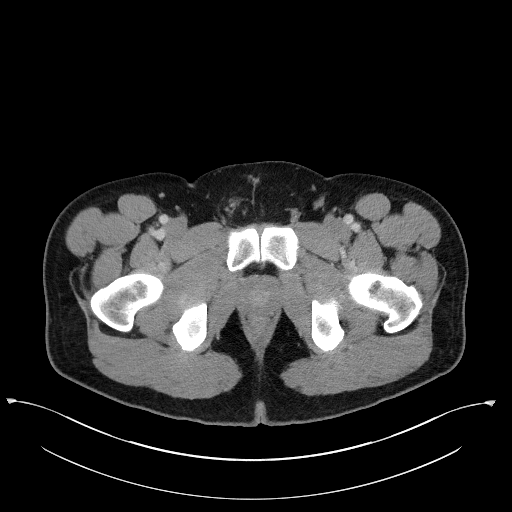
[im 13/140  lung]
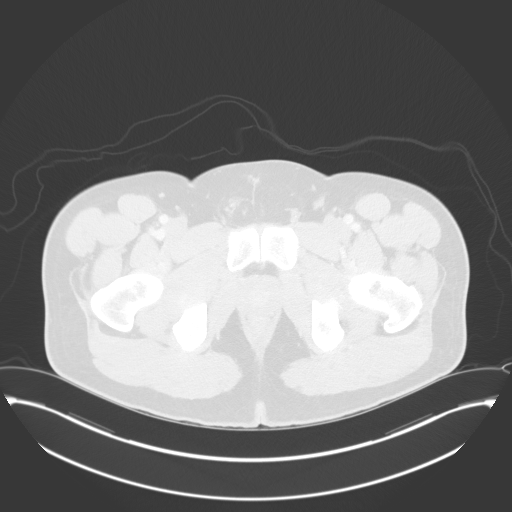
[im 26/140  lung]
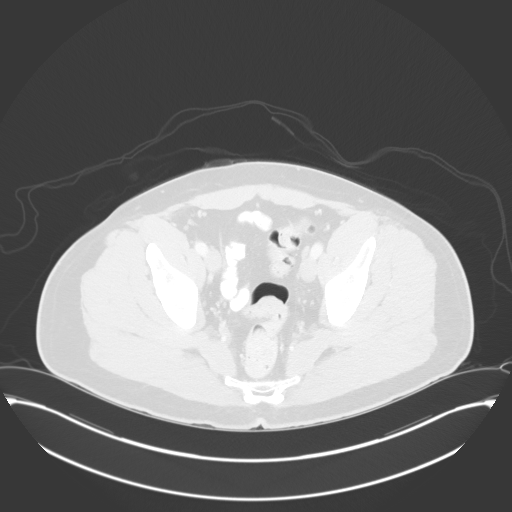
[im 38/140  lung]
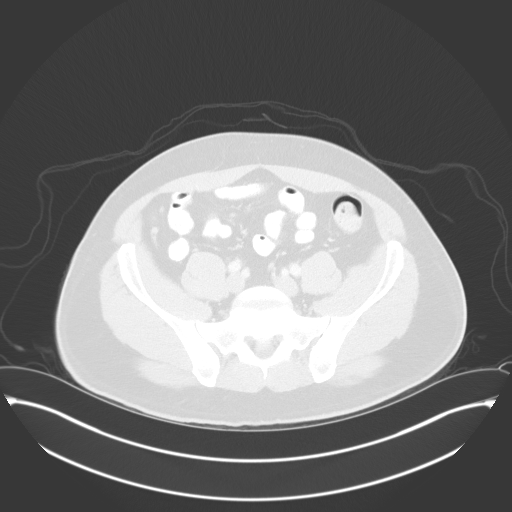
[im 51/140  lung]
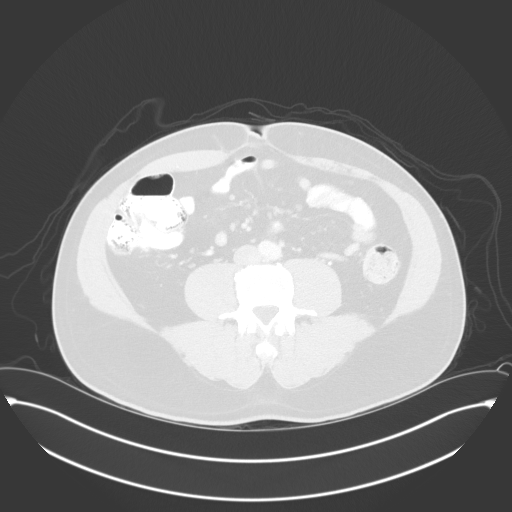
[im 64/140  mediastinal]
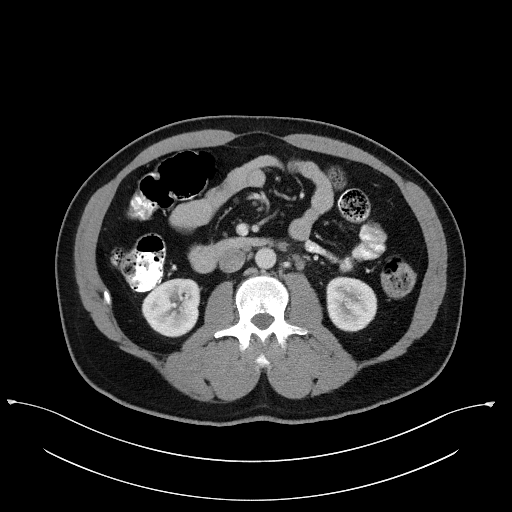
[im 64/140  lung]
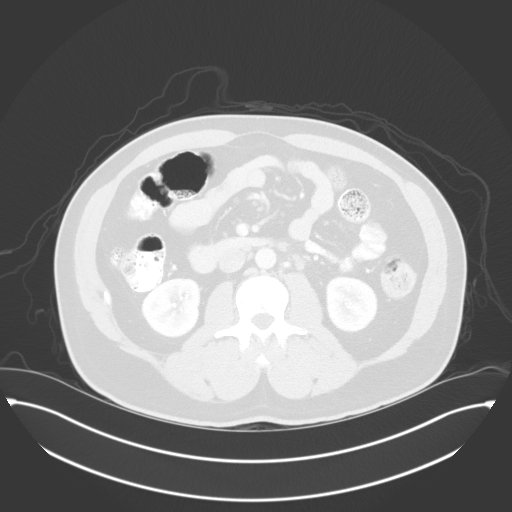
[im 76/140  lung]
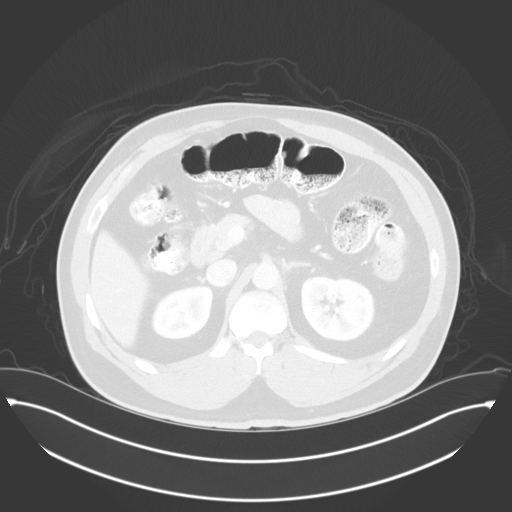
[im 89/140  lung]
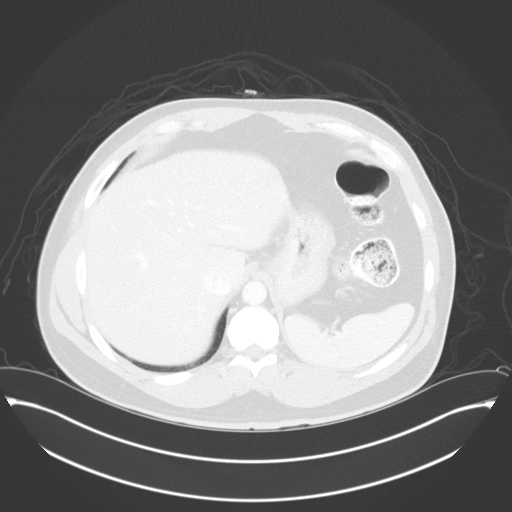
[im 102/140  lung]
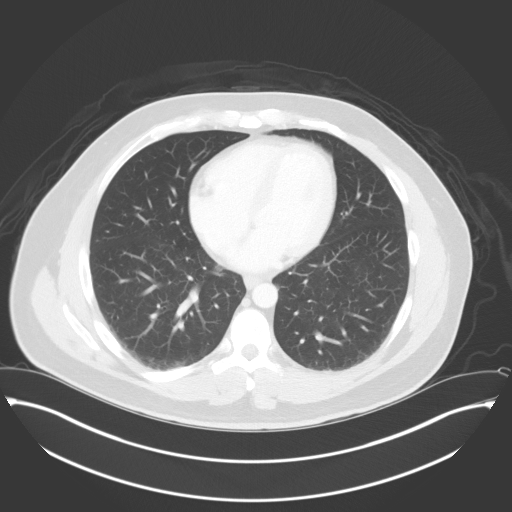
[im 114/140  mediastinal]
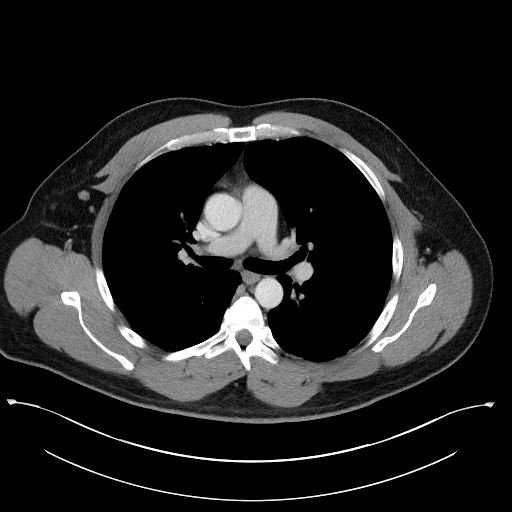
[im 114/140  lung]
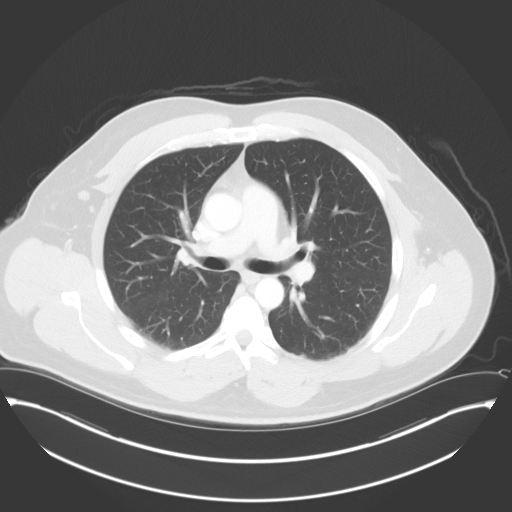
[im 127/140  lung]
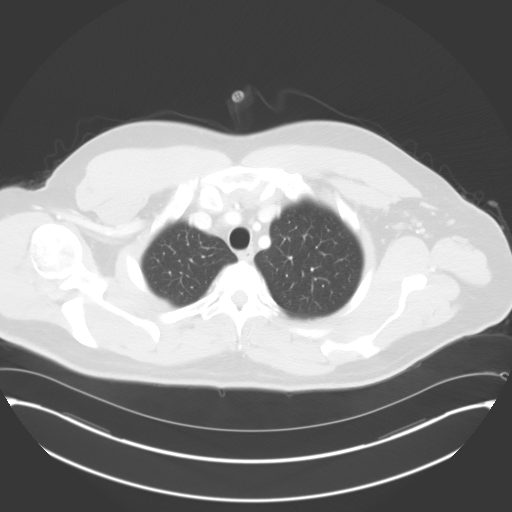

[Series 4: coronals · coronal · 0.83mm/px · 3 of 137 slices shown]
[im 28/137  lung]
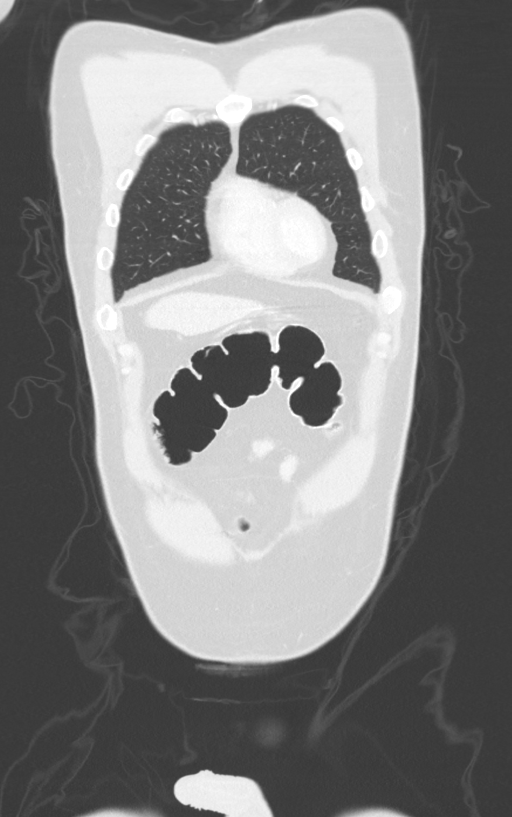
[im 55/137  lung]
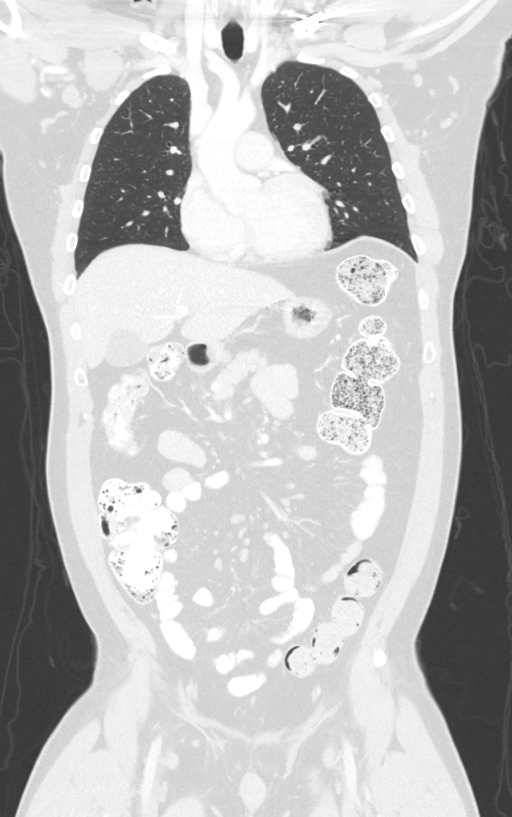
[im 82/137  lung]
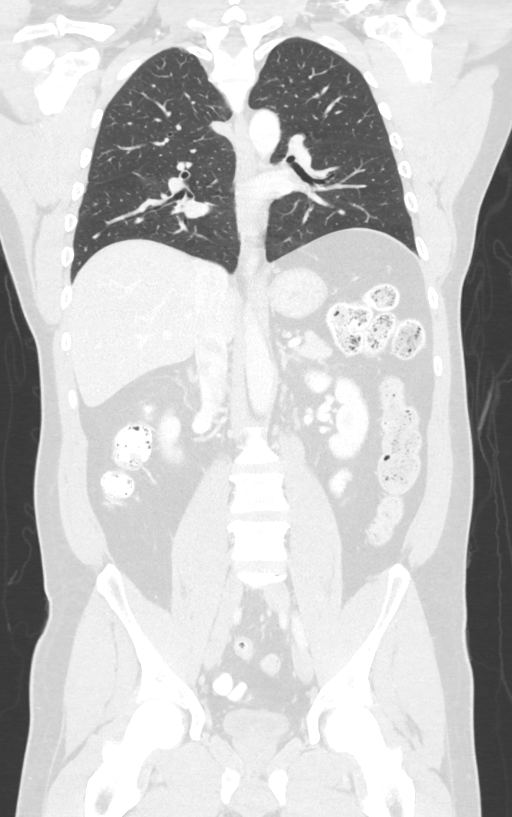

[13 of 36 positions shown; findings below may reference images not displayed]

FINDINGS: CT CHEST FINDINGS

Cardiovascular: The heart size is normal. No pericardial effusion.
Coronary artery calcification is evident.

Mediastinum/Nodes: No mediastinal lymphadenopathy. There is no hilar
lymphadenopathy. The esophagus has normal imaging features. There
are an increased number of lymph nodes in the axillary regions and
subpectoral areas bilaterally. These are not technically enlarged in
size except for a high right axillary lymph node measuring 13 mm
short axis (see image 18 of series 2). No supraclavicular
lymphadenopathy.

Lungs/Pleura: Lungs are clear bilaterally.

Musculoskeletal: Bone windows reveal no worrisome lytic or sclerotic
osseous lesions. Fixation hardware noted left clavicle.

CT ABDOMEN PELVIS FINDINGS

Hepatobiliary: No focal abnormality within the liver parenchyma.
There is no evidence for gallstones, gallbladder wall thickening, or
pericholecystic fluid. No intrahepatic or extrahepatic biliary
dilation.

Pancreas: No focal mass lesion. No dilatation of the main duct. No
intraparenchymal cyst. No peripancreatic edema.

Spleen: No splenomegaly. 9.5 cm craniocaudal length. No focal mass
lesion.

Adrenals/Urinary Tract: No adrenal nodule or mass. 8 mm exophytic
low-density lesion lower pole right kidney is likely a cyst. Left
kidney unremarkable. No evidence for hydroureter. The urinary
bladder appears normal for the degree of distention.

Stomach/Bowel: Stomach is nondistended. No gastric wall thickening.
No evidence of outlet obstruction. Duodenum is normally positioned
as is the ligament of Treitz. No small bowel wall thickening. No
small bowel dilatation. The terminal ileum is normal. The appendix
is normal. No gross colonic mass. No colonic wall thickening. No
substantial diverticular change.

Vascular/Lymphatic: No abdominal aortic aneurysm. Normal size lymph
nodes are seen scattered through the small bowel mesenteric. There
are upper normal retroperitoneal lymph nodes with 10 mm short axis
left para-aortic lymph node seen on image 70 of series 2. No pelvic
sidewall lymphadenopathy. Borderline common femoral lymph nodes are
seen bilaterally measuring 11-13 mm short axis. No lymphadenopathy
in either groin.

Reproductive: The prostate gland and seminal vesicles have normal
imaging features.

Other: No intraperitoneal free fluid.

Musculoskeletal: Bone windows reveal no worrisome lytic or sclerotic
osseous lesions.
IMPRESSION: 1. Increased number of axillary and subpectoral lymph nodes with 1
enlarged right axillary lymph node at 13 mm short axis. This is
associated with upper normal to borderline enlarged lymph nodes in
the retroperitoneal space and common femoral regions.
2. No splenomegaly.
3. No acute findings in the abdomen or pelvis.

## 2018-04-05 DIAGNOSIS — R5383 Other fatigue: Secondary | ICD-10-CM | POA: Diagnosis not present

## 2018-04-05 DIAGNOSIS — C9110 Chronic lymphocytic leukemia of B-cell type not having achieved remission: Secondary | ICD-10-CM | POA: Diagnosis not present

## 2018-04-05 DIAGNOSIS — I10 Essential (primary) hypertension: Secondary | ICD-10-CM | POA: Diagnosis not present

## 2018-06-28 DIAGNOSIS — I10 Essential (primary) hypertension: Secondary | ICD-10-CM | POA: Diagnosis not present

## 2018-07-23 DIAGNOSIS — R42 Dizziness and giddiness: Secondary | ICD-10-CM | POA: Diagnosis not present

## 2018-07-23 DIAGNOSIS — C9110 Chronic lymphocytic leukemia of B-cell type not having achieved remission: Secondary | ICD-10-CM | POA: Diagnosis not present

## 2018-07-23 DIAGNOSIS — R05 Cough: Secondary | ICD-10-CM | POA: Diagnosis not present

## 2018-09-27 DIAGNOSIS — C9110 Chronic lymphocytic leukemia of B-cell type not having achieved remission: Secondary | ICD-10-CM | POA: Diagnosis not present

## 2018-10-02 ENCOUNTER — Telehealth: Payer: Self-pay | Admitting: Hematology

## 2018-10-02 NOTE — Telephone Encounter (Signed)
Faxed medical records to [facility] @ [phone]. Release KV#[id number]

## 2018-10-03 DIAGNOSIS — H5213 Myopia, bilateral: Secondary | ICD-10-CM | POA: Diagnosis not present

## 2018-10-03 DIAGNOSIS — I10 Essential (primary) hypertension: Secondary | ICD-10-CM | POA: Diagnosis not present

## 2018-10-03 DIAGNOSIS — H35033 Hypertensive retinopathy, bilateral: Secondary | ICD-10-CM | POA: Diagnosis not present

## 2019-01-03 DIAGNOSIS — C9110 Chronic lymphocytic leukemia of B-cell type not having achieved remission: Secondary | ICD-10-CM | POA: Diagnosis not present

## 2019-01-24 DIAGNOSIS — N50819 Testicular pain, unspecified: Secondary | ICD-10-CM | POA: Diagnosis not present

## 2019-02-18 DIAGNOSIS — N503 Cyst of epididymis: Secondary | ICD-10-CM | POA: Diagnosis not present

## 2019-02-18 DIAGNOSIS — N433 Hydrocele, unspecified: Secondary | ICD-10-CM | POA: Diagnosis not present

## 2019-02-18 DIAGNOSIS — N442 Benign cyst of testis: Secondary | ICD-10-CM | POA: Diagnosis not present

## 2019-02-27 ENCOUNTER — Encounter (HOSPITAL_COMMUNITY): Payer: Self-pay | Admitting: Physician Assistant

## 2019-02-27 ENCOUNTER — Emergency Department (HOSPITAL_COMMUNITY)
Admission: EM | Admit: 2019-02-27 | Discharge: 2019-02-27 | Disposition: A | Discharge status: Home / Self Care | Payer: No Typology Code available for payment source | Attending: Emergency Medicine | Admitting: Emergency Medicine

## 2019-02-27 ENCOUNTER — Other Ambulatory Visit: Payer: Self-pay

## 2019-02-27 DIAGNOSIS — I10 Essential (primary) hypertension: Secondary | ICD-10-CM | POA: Insufficient documentation

## 2019-02-27 DIAGNOSIS — J029 Acute pharyngitis, unspecified: Secondary | ICD-10-CM | POA: Diagnosis not present

## 2019-02-27 DIAGNOSIS — B349 Viral infection, unspecified: Secondary | ICD-10-CM | POA: Insufficient documentation

## 2019-02-27 DIAGNOSIS — R5383 Other fatigue: Secondary | ICD-10-CM | POA: Insufficient documentation

## 2019-02-27 DIAGNOSIS — Z856 Personal history of leukemia: Secondary | ICD-10-CM | POA: Insufficient documentation

## 2019-02-27 DIAGNOSIS — R05 Cough: Secondary | ICD-10-CM | POA: Diagnosis present

## 2019-02-27 DIAGNOSIS — Z79899 Other long term (current) drug therapy: Secondary | ICD-10-CM | POA: Insufficient documentation

## 2019-02-27 HISTORY — DX: Chronic lymphocytic leukemia of B-cell type not having achieved remission: C91.10

## 2019-02-27 HISTORY — DX: Essential (primary) hypertension: I10

## 2019-02-27 LAB — RESPIRATORY PANEL BY PCR

## 2019-02-27 NOTE — ED Notes (Signed)
Patient verbalizes understanding of discharge instructions . Opportunity for questions and answers were provided . Armband removed by staff ,Pt discharged from ED. W/C  offered at D/C  and Declined W/C at D/C and was escorted to lobby by RN.  

## 2019-02-27 NOTE — ED Triage Notes (Signed)
PT reports Sore throat ,ciugh ,fatigue.  Started today

## 2019-02-27 NOTE — Discharge Instructions (Signed)
Please read the attached information.  I would recommend that she do self quarantine for 7 days since her symptoms started at least 3 days after they discontinue.  We have sent off a viral respiratory panel for you.  You will be notified of your results as they come available today.  Please return immediately if you develop any worsening signs or symptoms

## 2019-02-27 NOTE — ED Provider Notes (Signed)
[facility] EMERGENCY DEPARTMENT Provider Note   CSN: [id number] Arrival date & time: 02/27/19  1019  History   Chief Complaint Chief Complaint  Patient presents with  . Fatigue  . Cough  . Sore Throat    HPI Aaron Diaz is a 48 y.o. male.     HPI    48 year old male with a significant past medical history of hypertension CLL currently not in therapy presents today with complaints of 2 days of fatigue.  Patient states that he developed dry nonproductive cough, minor sore throat.  He denies any fevers.  He notes today he had onset of extreme weakness.  He notes a tightness in his chest but denies any significant shortness of breath or chest pain.  He continues to deny fever.  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     Past Medical History:  Diagnosis Date  . CLL (chronic lymphocytic leukemia) ([place])   . Hypertension     There are no active problems to display for this patient.   History reviewed. No pertinent surgical history.      Home Medications    Prior to Admission medications   Medication Sig Start Date End Date Taking? Authorizing Provider  Omega-3 Fatty Acids (FISH OIL) 1000 MG CAPS Take 2,000 mg by mouth daily.    [provider]  tamsulosin (FLOMAX) 0.4 MG CAPS capsule Take 0.8 mg by mouth daily.    [provider]  verapamil (CALAN-SR) 240 MG CR tablet Take 240 mg by mouth daily.    [provider]  Vitamin D, Cholecalciferol, 1000 units CAPS Take 5,000 Units by mouth daily.    [provider]    Family History History reviewed. No pertinent family history.  Social History Social History   Tobacco Use  . Smoking status: Never Smoker  . Smokeless tobacco: Never Used  Substance Use Topics  . Alcohol use: No  . Drug use: No     Allergies   Patient has no known allergies.   Review of Systems  Review of Systems  All other systems reviewed and are negative.    Physical Exam Updated Vital Signs BP 135/88   Pulse 71   Temp 98.1 F (36.7 C) (Oral)   Resp 16   Ht 5\' 10"  (1.778 m)   Wt 87.5 kg   SpO2 98%   BMI 27.69 kg/m   Physical Exam Vitals signs and nursing note reviewed.  Constitutional:      Appearance: He is well-developed.  HENT:     Head: Normocephalic and atraumatic.     Comments: Oropharynx is clear no erythema exudate or edema Eyes:     General: No scleral icterus.       Right eye: No discharge.        Left eye: No discharge.     Conjunctiva/sclera: Conjunctivae normal.     Pupils: Pupils are equal, round, and reactive to light.  Neck:     Musculoskeletal: Normal range of motion.     Vascular: No JVD.     Trachea: No tracheal deviation.  Cardiovascular:     Rate and Rhythm: Normal rate.  Pulmonary:     Effort: Pulmonary effort is normal. No respiratory distress.     Breath sounds: Normal breath sounds. No stridor. No wheezing, rhonchi or rales.  Chest:     Chest wall: No  tenderness.  Neurological:     Mental Status: He is alert and oriented to person, place, and time.     Coordination: Coordination normal.  Psychiatric:        Behavior: Behavior normal.        Thought Content: Thought content normal.        Judgment: Judgment normal.      ED Treatments / Results  Labs (all labs ordered are listed, but only abnormal results are displayed) Labs Reviewed  RESPIRATORY PANEL BY PCR    EKG None  Radiology No results found.  Procedures Procedures (including critical care time)  Medications Ordered in ED Medications - No data to display   Initial Impression / Assessment and Plan / ED Course  I have reviewed the triage vital signs and the nursing notes.  Pertinent labs & imaging results that were available during my care of the patient were reviewed by me and considered in my medical decision making (see chart for details).         48 year old male presents today with likely viral illness.  Patient has no signs of significant respiratory distress, has clear lung sounds.  Patient is afebrile here with no tachycardia.  I have low suspicion for influenza or coronavirus, given his significant past medical history he will have a viral respiratory panel performed.  I do not believe he needs coronavirus testing at this point.  Influenza testing is negative we will contact his primary care for ongoing evaluation.  evaluation.  If he has any worsening symptoms symptoms will return to emergency room for repeat evaluation.  Patient verbalized understanding and agreement to this plan.  Final Clinical Impressions(s) / ED Diagnoses   Final diagnoses:  Viral illness    ED Discharge Orders    None       Francee Gentile 02/27/19 [place], MD 03/01/19 2041

## 2019-04-04 DIAGNOSIS — C9110 Chronic lymphocytic leukemia of B-cell type not having achieved remission: Secondary | ICD-10-CM | POA: Diagnosis not present
